# Patient Record
Sex: Male | Born: 1945 | Race: White | Hispanic: No | Marital: Married | Smoking: Former smoker
Health system: Southern US, Community
[De-identification: ages and names within clinical notes are randomized; demographics above are authoritative.]

## PROBLEM LIST (undated history)

## (undated) DIAGNOSIS — I10 Essential (primary) hypertension: Secondary | ICD-10-CM

## (undated) DIAGNOSIS — M1990 Unspecified osteoarthritis, unspecified site: Secondary | ICD-10-CM

## (undated) DIAGNOSIS — I2510 Atherosclerotic heart disease of native coronary artery without angina pectoris: Secondary | ICD-10-CM

## (undated) DIAGNOSIS — C801 Malignant (primary) neoplasm, unspecified: Secondary | ICD-10-CM

## (undated) DIAGNOSIS — N289 Disorder of kidney and ureter, unspecified: Secondary | ICD-10-CM

## (undated) HISTORY — PX: CHOLECYSTECTOMY: SHX55

## (undated) HISTORY — PX: LITHOTRIPSY: SUR834

## (undated) HISTORY — PX: KNEE SURGERY: SHX244

## (undated) HISTORY — DX: Atherosclerotic heart disease of native coronary artery without angina pectoris: I25.10

---

## 2002-04-24 ENCOUNTER — Ambulatory Visit (HOSPITAL_COMMUNITY): Admission: RE | Admit: 2002-04-24 | Discharge: 2002-04-24 | Payer: Self-pay | Admitting: Family Medicine

## 2002-04-24 ENCOUNTER — Encounter: Payer: Self-pay | Admitting: Family Medicine

## 2002-08-03 ENCOUNTER — Ambulatory Visit (HOSPITAL_COMMUNITY): Admission: RE | Admit: 2002-08-03 | Discharge: 2002-08-03 | Payer: Self-pay | Admitting: Family Medicine

## 2002-08-03 ENCOUNTER — Encounter: Payer: Self-pay | Admitting: Family Medicine

## 2005-04-23 ENCOUNTER — Ambulatory Visit (HOSPITAL_COMMUNITY): Admission: RE | Admit: 2005-04-23 | Discharge: 2005-04-23 | Payer: Self-pay | Admitting: Family Medicine

## 2005-04-24 ENCOUNTER — Ambulatory Visit (HOSPITAL_COMMUNITY): Admission: RE | Admit: 2005-04-24 | Discharge: 2005-04-24 | Payer: Self-pay | Admitting: Urology

## 2005-04-30 ENCOUNTER — Ambulatory Visit (HOSPITAL_COMMUNITY): Admission: RE | Admit: 2005-04-30 | Discharge: 2005-04-30 | Payer: Self-pay | Admitting: Urology

## 2009-08-23 ENCOUNTER — Ambulatory Visit (HOSPITAL_COMMUNITY): Admission: RE | Admit: 2009-08-23 | Discharge: 2009-08-23 | Payer: Self-pay | Admitting: Family Medicine

## 2010-08-18 NOTE — H&P (Signed)
Richard Conway, Richard Conway               ACCOUNT NO.:  [id number]   MEDICAL RECORD NO.:  [id number]          PATIENT TYPE:  AMB   LOCATION:  DAY                           FACILITY:  [facility]   PHYSICIAN:  Dennie Maizes, M.D.   DATE OF BIRTH:  [birth year]   DATE OF ADMISSION:  04/30/2005  DATE OF DISCHARGE:  LH                                HISTORY & PHYSICAL   CHIEF COMPLAINT:  Intermittently left flank pain, left ureteral calculus  with obstruction.   HISTORY OF PRESENT ILLNESS:  This 65 year old male has a past history of  recurrent urolithiasis.  He passed stones in 1985 and 1997.  He has been  having intermittent left flank pain radiating to the front for the past two  weeks.  He also has suprapubic.  He has been evaluated with a noncontrast CT  scan of abdomen and pelvis.  This revealed a 3 cm size cyst in the right  kidney with a 7 mm sized left distal ureteral calculus obstruction and mild  hydroureter.  The patient has not been able to pass the stone.  There is  urinary frequency x5-6 and nocturia x0.  There is no history of hematuria,  fever or chills.  He was brought to the [facility] today for  cystoscopy, left retrograde pyelogram and urethroscopy stone extraction.   PAST MEDICAL HISTORY:  1.  Rectal and urolithiasis in 1985 and 1997.  2.  History of hypertension.  3.  Arthritis.  4.  Status post cholecystectomy.  5.  Status post arthroscopy knee surgery.   MEDICATIONS:  1.  Benazepril 10 mg one p.o. daily.  2.  Tylenol Arthritis.  3.  Aspirin 81 mg one p.o. daily which has been discontinued for the      surgery.   ALLERGIES:  None.   FAMILY HISTORY:  Positive for diabetes mellitus, cancer and heart disease.   MEDICATIONS:  Mepergan 50/25 mg one p.o. q.4 h p.r.n. pain.   PHYSICAL EXAMINATION:  VITAL SIGNS:  Height 6 feet 1 inches, weight 241  pounds.  HEENT:  Normal.  NECK:  No masses.  LUNGS:  Clear to auscultation.  HEART:  Regular rate and rhythm, no  murmurs.  ABDOMEN:  Soft, no palpable flank mass.  Moderate left costovertebral angle  tenderness is noted.  Bladder not palpable.  Penis and testis is normal.  RECTAL:  Benign prostate.   IMPRESSION:  1.  Left distal ureteral calculus with obstruction.  2.  Left renal colic.   PLAN:  Cystoscopy, left retrograde pyelogram, ureteroscopy stone extraction,  left ureteral stent placement and anesthesia in [facility].  I have  discussed with the patient regarding the diagnosis, operative details,  alternate treatment and outcome, possible risks and complications and he has  agreed for the procedure to be done.      Dennie Maizes, M.D.  Electronically Signed     SK/MEDQ  D:  04/30/2005  T:  04/30/2005  Job:  [id number]   cc:   Angus G. Renard Matter, MD  Fax: [phone]   [facility]

## 2010-08-18 NOTE — Op Note (Signed)
Richard Conway, Richard Conway               ACCOUNT NO.:  [id number]   MEDICAL RECORD NO.:  [id number]          PATIENT TYPE:  AMB   LOCATION:  DAY                           FACILITY:  [facility]   PHYSICIAN:  Dennie Maizes, M.D.   DATE OF BIRTH:  02-11-1946   DATE OF PROCEDURE:  DATE OF DISCHARGE:                                 OPERATIVE REPORT   PREOP DIAGNOSIS:  1.  Left distal ureteral calculus with obstruction.  2.  Left renal colic.   POSTOP DIAGNOSIS:  1.  Left distal ureteral calculus with obstruction.  2.  Left renal colic.   OPERATIVE PROCEDURE:  1.  Cystoscopy.  2.  Left retrograde pyelogram.  3.  Left ureteroscopic stone extraction.  4.  Left ureteral stent placement.   ANESTHESIA:  General.   SURGEON:  Dennie Maizes, M.D.   COMPLICATIONS:  None.   ESTIMATED BLOOD LOSS:  Minimal.   DRAINS:  A 6-French, 26 cm size left ureteral stent with a string.   INDICATIONS FOR PROCEDURE:  This 65 year old male has recurrent  urolithiasis. He was evaluated for left renal colic. He had a large stone in  the left distal ureter with obstruction. The patient was unable to pass the  stone. He was taken to the OR today for cystoscopy, left retrograde  pyelogram, ureteroscopic, stone extraction, and left ureteral stent  placement.   DESCRIPTION OF PROCEDURE:  General anesthesia was induced and the patient  was placed on the OR table in the dorsolithotomy position. The lower abdomen  and genitalia were prepped and draped in a sterile fashion. Cystoscopy was  done with the 25-French scope. Urethra was normal. There was moderate  hypertrophy with partial obstruction of the bladder neck area. The bladder  was examined and found to be normal. The trigone, ureteral orifices, and  bladder mucosa were unremarkable. A 5-French, wedge catheter was then placed  in the left ureteral orifice. About 7 mL of Renografin-60 was injected into  the collecting system and a retrograde pyelogram was done  with C-arm  fluoroscopy. There was a large filling defect in the left intramural ureter.  There was proximal hydroureter and hydronephrosis.   A 5-French open-ended catheter was then placed in the left distal ureter. A  0.138-inch Benson guidewire with a flexible tip was then advanced into the  left renal pelvis without any difficulty. The open-ended catheter was then  removed. The distal ureter was then dilated using an 18-French, 4-cm in  length balloon dilating catheter. The balloon dilating catheter was then  removed leaving the guidewire in place. Ureteroscopy was done with the rigid  ureteroscope. The stone was seen in the intramural ureter. The stone was  trapped in 4-wire Nitanol wire basket and removed without any difficulty.  The stone measured about 12 mm x 6 mm in size.   Ureteroscopy was done up to the level of the pelvic brim and no other  abnormality was noted. A 6-French 26 cm size stent with a string was then  inserted into the left collecting system. The instruments were removed. The  patient remained stable throughout  the procedure. He was transferred to the  PACU in a satisfactory condition.      Dennie Maizes, M.D.  Electronically Signed     SK/MEDQ  D:  04/30/2005  T:  04/30/2005  Job:  [id number]   cc:   Angus G. Renard Matter, MD  Fax: [phone]

## 2012-11-01 ENCOUNTER — Encounter (HOSPITAL_COMMUNITY): Payer: Self-pay

## 2012-11-01 ENCOUNTER — Emergency Department (HOSPITAL_COMMUNITY)
Admission: EM | Admit: 2012-11-01 | Discharge: 2012-11-01 | Disposition: A | Discharge status: Home / Self Care | Payer: Medicare Other | Attending: Emergency Medicine | Admitting: Emergency Medicine

## 2012-11-01 DIAGNOSIS — R5383 Other fatigue: Secondary | ICD-10-CM | POA: Insufficient documentation

## 2012-11-01 DIAGNOSIS — Z87448 Personal history of other diseases of urinary system: Secondary | ICD-10-CM | POA: Insufficient documentation

## 2012-11-01 DIAGNOSIS — Z8669 Personal history of other diseases of the nervous system and sense organs: Secondary | ICD-10-CM | POA: Insufficient documentation

## 2012-11-01 DIAGNOSIS — I10 Essential (primary) hypertension: Secondary | ICD-10-CM | POA: Insufficient documentation

## 2012-11-01 DIAGNOSIS — Z8739 Personal history of other diseases of the musculoskeletal system and connective tissue: Secondary | ICD-10-CM | POA: Insufficient documentation

## 2012-11-01 DIAGNOSIS — R5381 Other malaise: Secondary | ICD-10-CM | POA: Insufficient documentation

## 2012-11-01 DIAGNOSIS — Z79899 Other long term (current) drug therapy: Secondary | ICD-10-CM | POA: Insufficient documentation

## 2012-11-01 DIAGNOSIS — R42 Dizziness and giddiness: Secondary | ICD-10-CM | POA: Insufficient documentation

## 2012-11-01 HISTORY — DX: Disorder of kidney and ureter, unspecified: N28.9

## 2012-11-01 HISTORY — DX: Unspecified osteoarthritis, unspecified site: M19.90

## 2012-11-01 HISTORY — DX: Essential (primary) hypertension: I10

## 2012-11-01 LAB — BASIC METABOLIC PANEL
CO2: 23 mEq/L (ref 19–32)
Chloride: 102 mEq/L (ref 96–112)
Glucose, Bld: 117 mg/dL — ABNORMAL HIGH (ref 70–99)
Potassium: 3.6 mEq/L (ref 3.5–5.1)
Sodium: 138 mEq/L (ref 135–145)

## 2012-11-01 LAB — CBC WITH DIFFERENTIAL/PLATELET
Basophils Absolute: 0 10*3/uL (ref 0.0–0.1)
Basophils Relative: 0 % (ref 0–1)
Eosinophils Absolute: 0.1 10*3/uL (ref 0.0–0.7)
Eosinophils Relative: 1 % (ref 0–5)
Hemoglobin: 15.1 g/dL (ref 13.0–17.0)
MCH: 31.8 pg (ref 26.0–34.0)
MCHC: 34 g/dL (ref 30.0–36.0)
MCV: 93.5 fL (ref 78.0–100.0)
Monocytes Absolute: 0.6 10*3/uL (ref 0.1–1.0)
Monocytes Relative: 6 % (ref 3–12)
RBC: 4.75 MIL/uL (ref 4.22–5.81)

## 2012-11-01 LAB — TROPONIN I: Troponin I: <0.3 ng/mL (ref <0.30)

## 2012-11-01 MED ORDER — SODIUM CHLORIDE 0.9 % IV BOLUS (SEPSIS)
1000.0000 mL | Freq: Once | INTRAVENOUS | Status: AC
  Administered 2012-11-01: 1000 mL via INTRAVENOUS

## 2012-11-01 MED ORDER — MECLIZINE HCL 25 MG PO TABS: 25.0000 mg | ORAL_TABLET | Freq: Four times a day (QID) | ORAL | Status: DC | PRN

## 2012-11-01 NOTE — ED Notes (Signed)
Pt states he almost passed out Thursday and again today. Denies other symptoms

## 2012-11-01 NOTE — ED Notes (Signed)
Pt states that he was dizzy from lying to setting but not setting to standing. Richard Conway

## 2012-11-01 NOTE — ED Provider Notes (Signed)
CSN: [id number]     Arrival date & time 11/01/12  1317 History     First MD Initiated Contact with Patient 11/01/12 1353     Chief Complaint  Patient presents with  . Near Syncope   (Consider location/radiation/quality/duration/timing/severity/associated sxs/prior Treatment) HPI This 67 year old male had a spell for several minutes today of sudden vertigo and previous similar spell 2 days ago also lasting for several minutes, each time he been walking from his kitchen towards his bedroom and he turned and had sudden room spinning sensation without headache change in hearing or change in speech vision swallowing or understanding, he had no chest pain palpitations or shortness of breath, he is no nausea or vomiting, he had no focal or lateralizing weakness or numbness or incoordination but felt generally weak, there is no trauma, he denies feeling lightheaded as if he is going to faint, he has chronic tinnitus which is unchanged recently, 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 Past Medical History  Diagnosis Date  . Arthritis   . Hypertension   . Renal disorder    Past Surgical History  Procedure Laterality Date  . Cholecystectomy     History reviewed. No pertinent family history. History  Substance Use Topics  . Smoking status: Not on file  . Smokeless tobacco: Not on file  . Alcohol Use: No    Review of Systems 10 Systems reviewed and are negative for acute change except as noted in the HPI. Allergies  Advil  Home Medications   Current Outpatient Rx  Name  Route  Sig  Dispense  Refill  . benazepril (LOTENSIN) 10 MG tablet   Oral   Take 10 mg by mouth daily.         . Glucosamine HCl (GLUCOSAMINE PO)   Oral   Take 1  tablet by mouth daily.         . meloxicam (MOBIC) 15 MG tablet   Oral   Take 7.5 mg by mouth daily.         . Omega-3 Fatty Acids (FISH OIL) 500 MG CAPS   Oral   Take 1 capsule by mouth 2 (two) times daily.         Marland Kitchen HYDROcodone-acetaminophen (NORCO) 7.5-325 MG per tablet   Oral   Take 1 tablet by mouth 2 (two) times daily as needed for pain.         . meclizine (ANTIVERT) 25 MG tablet   Oral   Take 1 tablet (25 mg total) by mouth every 6 (six) hours as needed for dizziness.   20 tablet   0   . PRESCRIPTION MEDICATION   Injection   Inject 1 each as directed every 6 (six) months. Steroid injection          BP 144/129  Pulse 129  Temp(Src) 98.5 F (36.9 C) (Oral)  Resp 20  Ht 6\' 1"  (1.854 m)  Wt 255 lb (115.667 kg)  BMI 33.65 kg/m2  SpO2 100% Physical Exam  Nursing note and vitals reviewed. Constitutional:  Awake, alert, nontoxic appearance with baseline speech for patient.  HENT:  Head: Atraumatic.  Mouth/Throat: No oropharyngeal exudate.  Eyes: EOM are normal. Pupils are equal, round, and reactive to light. Right eye exhibits no discharge. Left  eye exhibits no discharge.  No nystagmus, negative test of skew  Neck: Neck supple.  Cardiovascular: Normal rate and regular rhythm.   No murmur heard. Pulmonary/Chest: Effort normal and breath sounds normal. No stridor. No respiratory distress. He has no wheezes. He has no rales. He exhibits no tenderness.  Abdominal: Soft. Bowel sounds are normal. He exhibits no mass. There is no tenderness. There is no rebound.  Musculoskeletal: He exhibits no edema and no tenderness.  Baseline ROM, moves extremities with no obvious new focal weakness.  Lymphadenopathy:    He has no cervical adenopathy.  Neurological: He is alert.  Awake, alert, cooperative and aware of situation; motor strength bilaterally; sensation normal to light touch bilaterally; peripheral visual fields full to confrontation; no facial asymmetry; tongue  midline; major cranial nerves appear intact; no pronator drift, normal finger to nose bilaterally, baseline gait without new ataxia.  Skin: No rash noted.  Psychiatric: He has a normal mood and affect.    ED Course  ECG: Normal sinus rhythm, ventricular rate 91, normal axis, no acute ischemic changes noted, no comparison ECG available Procedures (including critical care time) Pt stable in ED with no significant deterioration in condition.Pt walking without difficulty. Patient / Family / Caregiver informed of clinical course, understand medical decision-making process, and agree with plan. Labs Reviewed  CBC WITH DIFFERENTIAL - Abnormal; Notable for the following:    Neutrophils Relative % 79 (*)    Neutro Abs 8.0 (*)    All other components within normal limits  BASIC METABOLIC PANEL - Abnormal; Notable for the following:    Glucose, Bld 117 (*)    All other components within normal limits  TROPONIN I   No results found. 1. Vertigo     MDM  I doubt any other EMC precluding discharge at this time including, but not necessarily limited to the following:SAH, CVA, Vtach.  Hurman Horn, MD 11/01/12 [phone]

## 2015-04-03 ENCOUNTER — Encounter (HOSPITAL_COMMUNITY): Payer: Self-pay | Admitting: Emergency Medicine

## 2015-04-03 ENCOUNTER — Emergency Department (HOSPITAL_COMMUNITY): Payer: Medicare Other

## 2015-04-03 ENCOUNTER — Emergency Department (HOSPITAL_COMMUNITY)
Admission: EM | Admit: 2015-04-03 | Discharge: 2015-04-03 | Disposition: A | Discharge status: Home / Self Care | Payer: Medicare Other | Attending: Emergency Medicine | Admitting: Emergency Medicine

## 2015-04-03 DIAGNOSIS — M1990 Unspecified osteoarthritis, unspecified site: Secondary | ICD-10-CM | POA: Insufficient documentation

## 2015-04-03 DIAGNOSIS — I10 Essential (primary) hypertension: Secondary | ICD-10-CM | POA: Insufficient documentation

## 2015-04-03 DIAGNOSIS — Z79899 Other long term (current) drug therapy: Secondary | ICD-10-CM | POA: Diagnosis not present

## 2015-04-03 DIAGNOSIS — Z791 Long term (current) use of non-steroidal anti-inflammatories (NSAID): Secondary | ICD-10-CM | POA: Insufficient documentation

## 2015-04-03 DIAGNOSIS — Z87442 Personal history of urinary calculi: Secondary | ICD-10-CM | POA: Diagnosis not present

## 2015-04-03 DIAGNOSIS — J019 Acute sinusitis, unspecified: Secondary | ICD-10-CM | POA: Insufficient documentation

## 2015-04-03 DIAGNOSIS — R05 Cough: Secondary | ICD-10-CM | POA: Diagnosis present

## 2015-04-03 LAB — RAPID STREP SCREEN (MED CTR MEBANE ONLY): Streptococcus, Group A Screen (Direct): NEGATIVE

## 2015-04-03 MED ORDER — AMOXICILLIN-POT CLAVULANATE ER 1000-62.5 MG PO TB12: 2.0000 | ORAL_TABLET | Freq: Two times a day (BID) | ORAL | Status: DC

## 2015-04-03 MED ORDER — AMOXICILLIN-POT CLAVULANATE 875-125 MG PO TABS
ORAL_TABLET | ORAL | Status: AC
  Filled 2015-04-03: qty 1

## 2015-04-03 NOTE — ED Provider Notes (Signed)
CSN: [id number]     Arrival date & time 04/03/15  1227 History   First MD Initiated Contact with Patient 04/03/15 1353     Chief Complaint  Patient presents with  . Cough      Patient is a 70 y.o. male presenting with cough. The history is provided by the patient.  Cough Associated symptoms: myalgias and sore throat   Associated symptoms: no chest pain, no headaches, no rash and no shortness of breath    patient has had a cough for the last 3 weeks. Seen by his primary care doctor and was given what sounds like azithromycin. States he has not gotten any better. He's felt worse last few days. He's had a nonproductive cough. He does have sinus congestion and pressure. He has had a sore throat started last few days. States he aches all over. No nausea or vomiting. No diarrhea. No fevers.  Past Medical History  Diagnosis Date  . Arthritis   . Hypertension   . Renal disorder     kidney stones   Past Surgical History  Procedure Laterality Date  . Cholecystectomy    . Knee surgery     No family history on file. Social History  Substance Use Topics  . Smoking status: Never Smoker   . Smokeless tobacco: None  . Alcohol Use: No    Review of Systems  Constitutional: Positive for fatigue. Negative for activity change and appetite change.  HENT: Positive for congestion, sinus pressure and sore throat. Negative for trouble swallowing.   Eyes: Negative for pain.  Respiratory: Positive for cough. Negative for chest tightness and shortness of breath.   Cardiovascular: Negative for chest pain and leg swelling.  Gastrointestinal: Negative for nausea, vomiting, abdominal pain and diarrhea.  Genitourinary: Negative for flank pain.  Musculoskeletal: Positive for myalgias. Negative for back pain and neck stiffness.  Skin: Negative for rash.  Neurological: Negative for weakness, numbness and headaches.  Psychiatric/Behavioral: Negative for behavioral problems.      Allergies  Advil and  Nyquil multi-symptom  Home Medications   Prior to Admission medications   Medication Sig Start Date End Date Taking? Authorizing Provider  acetaminophen (TYLENOL) 325 MG tablet Take 650 mg by mouth every 4 (four) hours as needed for mild pain.   Yes Historical Provider, MD  benazepril (LOTENSIN) 10 MG tablet Take 10 mg by mouth daily.   Yes Historical Provider, MD  Glucosamine HCl (GLUCOSAMINE PO) Take 1 tablet by mouth daily.   Yes Historical Provider, MD  meclizine (ANTIVERT) 25 MG tablet Take 1 tablet (25 mg total) by mouth every 6 (six) hours as needed for dizziness. Patient taking differently: Take 25 mg by mouth 2 (two) times daily.  11/01/12  Yes Riki Altes, MD  meloxicam (MOBIC) 15 MG tablet Take 7.5 mg by mouth daily.   Yes Historical Provider, MD  Multiple Vitamin (MULTIVITAMIN WITH MINERALS) TABS tablet Take 1 tablet by mouth daily.   Yes Historical Provider, MD  Omega-3 Fatty Acids (FISH OIL) 500 MG CAPS Take 1 capsule by mouth 2 (two) times daily.   Yes Historical Provider, MD  amoxicillin-clavulanate (AUGMENTIN XR) 1000-62.5 MG 12 hr tablet Take 2 tablets by mouth 2 (two) times daily. 04/03/15   Davonna Belling, MD  PRESCRIPTION MEDICATION Inject 1 each as directed every 6 (six) months. Steroid injection    Historical Provider, MD   BP 190/102 mmHg  Pulse 75  Temp(Src) 98 F (36.7 C) (Oral)  Resp 18  Ht 6'  1" (1.854 m)  Wt 250 lb (113.399 kg)  BMI 32.99 kg/m2  SpO2 97% Physical Exam  Constitutional: He appears well-developed.  HENT:  Mouth/Throat: No oropharyngeal exudate.  Posterior pharyngeal erythema without exudate.  Neck: Neck supple.  Cardiovascular: Normal rate.   Pulmonary/Chest: Effort normal. No respiratory distress. He has no wheezes.  Abdominal: Soft. There is no tenderness.  Musculoskeletal: Normal range of motion.  Neurological: He is alert.  Skin: Skin is warm.    ED Course  Procedures (including critical care time) Labs Review Labs Reviewed   RAPID STREP SCREEN (NOT AT [facility])  CULTURE, GROUP A STREP    Imaging Review Dg Chest 2 View  04/03/2015  CLINICAL DATA:  Initial encounter for three-week history of cough with fever and nausea worsening last night. EXAM: CHEST  2 VIEW COMPARISON:  None. FINDINGS: Asymmetric elevation right hemidiaphragm noted. The lungs are clear wiithout focal pneumonia, edema, pneumothorax or pleural effusion. The cardiopericardial silhouette is within normal limits for size. The visualized bony structures of the thorax are intact. IMPRESSION: No active cardiopulmonary disease. Electronically Signed   By: Misty Stanley M.D.   On: 04/03/2015 13:33   I have personally reviewed and evaluated these images and lab results as part of my medical decision-making.   EKG Interpretation None      MDM   Final diagnoses:  Acute sinusitis, recurrence not specified, unspecified location    Patient with cough. Also sinus pressure and sore throat. Had been treated for bronchitis but with the new sinus pressure and possible chills now we'll treat as sinusitis. Will discharge home.    Davonna Belling, MD 04/03/15 2113

## 2015-04-03 NOTE — Discharge Instructions (Signed)

## 2015-04-03 NOTE — ED Notes (Signed)
MD at bedside. 

## 2015-04-03 NOTE — ED Notes (Signed)
Pt reports fever, cough, nasal/chest congestion, and nausea x 3 weeks. Pt reports thick, yellow sputum,. Pt states he was seen by PCP 3 weeks ago, but symptoms have not improved.

## 2015-04-06 LAB — CULTURE, GROUP A STREP: STREP A CULTURE: NEGATIVE

## 2016-01-11 DIAGNOSIS — M25569 Pain in unspecified knee: Secondary | ICD-10-CM | POA: Diagnosis not present

## 2016-01-11 DIAGNOSIS — Z23 Encounter for immunization: Secondary | ICD-10-CM | POA: Diagnosis not present

## 2016-01-11 DIAGNOSIS — I10 Essential (primary) hypertension: Secondary | ICD-10-CM | POA: Diagnosis not present

## 2016-01-11 DIAGNOSIS — M1990 Unspecified osteoarthritis, unspecified site: Secondary | ICD-10-CM | POA: Diagnosis not present

## 2016-02-20 DIAGNOSIS — I10 Essential (primary) hypertension: Secondary | ICD-10-CM | POA: Diagnosis not present

## 2016-02-28 DIAGNOSIS — M159 Polyosteoarthritis, unspecified: Secondary | ICD-10-CM | POA: Diagnosis not present

## 2016-02-28 DIAGNOSIS — I10 Essential (primary) hypertension: Secondary | ICD-10-CM | POA: Diagnosis not present

## 2016-02-28 DIAGNOSIS — E782 Mixed hyperlipidemia: Secondary | ICD-10-CM | POA: Diagnosis not present

## 2016-03-22 DIAGNOSIS — H6502 Acute serous otitis media, left ear: Secondary | ICD-10-CM | POA: Diagnosis not present

## 2016-04-04 DIAGNOSIS — H6503 Acute serous otitis media, bilateral: Secondary | ICD-10-CM | POA: Diagnosis not present

## 2016-08-29 DIAGNOSIS — I10 Essential (primary) hypertension: Secondary | ICD-10-CM | POA: Diagnosis not present

## 2016-08-31 DIAGNOSIS — I10 Essential (primary) hypertension: Secondary | ICD-10-CM | POA: Diagnosis not present

## 2016-08-31 DIAGNOSIS — M1990 Unspecified osteoarthritis, unspecified site: Secondary | ICD-10-CM | POA: Diagnosis not present

## 2016-08-31 DIAGNOSIS — E782 Mixed hyperlipidemia: Secondary | ICD-10-CM | POA: Diagnosis not present

## 2017-02-27 DIAGNOSIS — E782 Mixed hyperlipidemia: Secondary | ICD-10-CM | POA: Diagnosis not present

## 2017-02-27 DIAGNOSIS — I10 Essential (primary) hypertension: Secondary | ICD-10-CM | POA: Diagnosis not present

## 2017-02-27 DIAGNOSIS — Z125 Encounter for screening for malignant neoplasm of prostate: Secondary | ICD-10-CM | POA: Diagnosis not present

## 2017-03-04 DIAGNOSIS — I10 Essential (primary) hypertension: Secondary | ICD-10-CM | POA: Diagnosis not present

## 2017-06-05 DIAGNOSIS — R7301 Impaired fasting glucose: Secondary | ICD-10-CM | POA: Diagnosis not present

## 2017-06-05 DIAGNOSIS — M1990 Unspecified osteoarthritis, unspecified site: Secondary | ICD-10-CM | POA: Diagnosis not present

## 2017-09-02 DIAGNOSIS — I10 Essential (primary) hypertension: Secondary | ICD-10-CM | POA: Diagnosis not present

## 2017-09-02 DIAGNOSIS — R7301 Impaired fasting glucose: Secondary | ICD-10-CM | POA: Diagnosis not present

## 2017-09-02 DIAGNOSIS — E782 Mixed hyperlipidemia: Secondary | ICD-10-CM | POA: Diagnosis not present

## 2017-09-04 DIAGNOSIS — M1990 Unspecified osteoarthritis, unspecified site: Secondary | ICD-10-CM | POA: Diagnosis not present

## 2017-09-04 DIAGNOSIS — E782 Mixed hyperlipidemia: Secondary | ICD-10-CM | POA: Diagnosis not present

## 2017-09-04 DIAGNOSIS — I10 Essential (primary) hypertension: Secondary | ICD-10-CM | POA: Diagnosis not present

## 2017-09-04 DIAGNOSIS — R7301 Impaired fasting glucose: Secondary | ICD-10-CM | POA: Diagnosis not present

## 2018-02-17 DIAGNOSIS — Z23 Encounter for immunization: Secondary | ICD-10-CM | POA: Diagnosis not present

## 2018-03-03 DIAGNOSIS — R7301 Impaired fasting glucose: Secondary | ICD-10-CM | POA: Diagnosis not present

## 2018-03-03 DIAGNOSIS — I10 Essential (primary) hypertension: Secondary | ICD-10-CM | POA: Diagnosis not present

## 2018-03-03 DIAGNOSIS — E782 Mixed hyperlipidemia: Secondary | ICD-10-CM | POA: Diagnosis not present

## 2018-03-17 DIAGNOSIS — I10 Essential (primary) hypertension: Secondary | ICD-10-CM | POA: Diagnosis not present

## 2018-03-17 DIAGNOSIS — Z0000 Encounter for general adult medical examination without abnormal findings: Secondary | ICD-10-CM | POA: Diagnosis not present

## 2018-03-17 DIAGNOSIS — R7303 Prediabetes: Secondary | ICD-10-CM | POA: Diagnosis not present

## 2018-03-17 DIAGNOSIS — E782 Mixed hyperlipidemia: Secondary | ICD-10-CM | POA: Diagnosis not present

## 2018-03-17 DIAGNOSIS — M1990 Unspecified osteoarthritis, unspecified site: Secondary | ICD-10-CM | POA: Diagnosis not present

## 2018-04-14 DIAGNOSIS — Z0000 Encounter for general adult medical examination without abnormal findings: Secondary | ICD-10-CM | POA: Diagnosis not present

## 2018-04-14 DIAGNOSIS — H9190 Unspecified hearing loss, unspecified ear: Secondary | ICD-10-CM | POA: Diagnosis not present

## 2018-04-14 DIAGNOSIS — I10 Essential (primary) hypertension: Secondary | ICD-10-CM | POA: Diagnosis not present

## 2018-04-29 DIAGNOSIS — I10 Essential (primary) hypertension: Secondary | ICD-10-CM | POA: Diagnosis not present

## 2018-04-29 DIAGNOSIS — H9190 Unspecified hearing loss, unspecified ear: Secondary | ICD-10-CM | POA: Diagnosis not present

## 2018-05-15 ENCOUNTER — Ambulatory Visit (INDEPENDENT_AMBULATORY_CARE_PROVIDER_SITE_OTHER): Payer: Medicare Other | Admitting: Otolaryngology

## 2018-05-15 DIAGNOSIS — H903 Sensorineural hearing loss, bilateral: Secondary | ICD-10-CM | POA: Diagnosis not present

## 2018-07-21 DIAGNOSIS — M25562 Pain in left knee: Secondary | ICD-10-CM | POA: Diagnosis not present

## 2018-08-18 DIAGNOSIS — R7301 Impaired fasting glucose: Secondary | ICD-10-CM | POA: Diagnosis not present

## 2018-08-18 DIAGNOSIS — R7303 Prediabetes: Secondary | ICD-10-CM | POA: Diagnosis not present

## 2018-08-21 DIAGNOSIS — H9190 Unspecified hearing loss, unspecified ear: Secondary | ICD-10-CM | POA: Diagnosis not present

## 2018-08-21 DIAGNOSIS — R7303 Prediabetes: Secondary | ICD-10-CM | POA: Diagnosis not present

## 2018-08-21 DIAGNOSIS — R7301 Impaired fasting glucose: Secondary | ICD-10-CM | POA: Diagnosis not present

## 2018-08-21 DIAGNOSIS — E782 Mixed hyperlipidemia: Secondary | ICD-10-CM | POA: Diagnosis not present

## 2018-08-21 DIAGNOSIS — I10 Essential (primary) hypertension: Secondary | ICD-10-CM | POA: Diagnosis not present

## 2018-09-09 DIAGNOSIS — I10 Essential (primary) hypertension: Secondary | ICD-10-CM | POA: Diagnosis not present

## 2018-09-09 DIAGNOSIS — R7303 Prediabetes: Secondary | ICD-10-CM | POA: Diagnosis not present

## 2018-09-09 DIAGNOSIS — H9190 Unspecified hearing loss, unspecified ear: Secondary | ICD-10-CM | POA: Diagnosis not present

## 2018-09-09 DIAGNOSIS — E782 Mixed hyperlipidemia: Secondary | ICD-10-CM | POA: Diagnosis not present

## 2018-09-09 DIAGNOSIS — R7301 Impaired fasting glucose: Secondary | ICD-10-CM | POA: Diagnosis not present

## 2018-11-13 DIAGNOSIS — I10 Essential (primary) hypertension: Secondary | ICD-10-CM | POA: Diagnosis not present

## 2018-11-13 DIAGNOSIS — M1990 Unspecified osteoarthritis, unspecified site: Secondary | ICD-10-CM | POA: Diagnosis not present

## 2018-11-13 DIAGNOSIS — M545 Low back pain: Secondary | ICD-10-CM | POA: Diagnosis not present

## 2018-11-13 DIAGNOSIS — H9190 Unspecified hearing loss, unspecified ear: Secondary | ICD-10-CM | POA: Diagnosis not present

## 2019-02-16 DIAGNOSIS — R7301 Impaired fasting glucose: Secondary | ICD-10-CM | POA: Diagnosis not present

## 2019-02-16 DIAGNOSIS — R7303 Prediabetes: Secondary | ICD-10-CM | POA: Diagnosis not present

## 2019-02-18 DIAGNOSIS — H9190 Unspecified hearing loss, unspecified ear: Secondary | ICD-10-CM | POA: Diagnosis not present

## 2019-02-18 DIAGNOSIS — R7301 Impaired fasting glucose: Secondary | ICD-10-CM | POA: Diagnosis not present

## 2019-02-18 DIAGNOSIS — E782 Mixed hyperlipidemia: Secondary | ICD-10-CM | POA: Diagnosis not present

## 2019-02-18 DIAGNOSIS — I10 Essential (primary) hypertension: Secondary | ICD-10-CM | POA: Diagnosis not present

## 2019-02-18 DIAGNOSIS — R7303 Prediabetes: Secondary | ICD-10-CM | POA: Diagnosis not present

## 2019-04-27 DIAGNOSIS — M1990 Unspecified osteoarthritis, unspecified site: Secondary | ICD-10-CM | POA: Diagnosis not present

## 2019-04-27 DIAGNOSIS — M545 Low back pain: Secondary | ICD-10-CM | POA: Diagnosis not present

## 2019-04-27 DIAGNOSIS — H9190 Unspecified hearing loss, unspecified ear: Secondary | ICD-10-CM | POA: Diagnosis not present

## 2019-05-11 DIAGNOSIS — R0602 Shortness of breath: Secondary | ICD-10-CM | POA: Diagnosis not present

## 2019-05-11 DIAGNOSIS — Z136 Encounter for screening for cardiovascular disorders: Secondary | ICD-10-CM | POA: Diagnosis not present

## 2019-05-11 DIAGNOSIS — R079 Chest pain, unspecified: Secondary | ICD-10-CM | POA: Diagnosis not present

## 2019-05-11 DIAGNOSIS — R42 Dizziness and giddiness: Secondary | ICD-10-CM | POA: Diagnosis not present

## 2019-05-20 DIAGNOSIS — R079 Chest pain, unspecified: Secondary | ICD-10-CM | POA: Diagnosis not present

## 2019-05-20 DIAGNOSIS — M545 Low back pain: Secondary | ICD-10-CM | POA: Diagnosis not present

## 2019-05-20 DIAGNOSIS — R0602 Shortness of breath: Secondary | ICD-10-CM | POA: Diagnosis not present

## 2019-05-20 DIAGNOSIS — R42 Dizziness and giddiness: Secondary | ICD-10-CM | POA: Diagnosis not present

## 2019-06-08 DIAGNOSIS — M79604 Pain in right leg: Secondary | ICD-10-CM | POA: Diagnosis not present

## 2019-06-08 DIAGNOSIS — R0602 Shortness of breath: Secondary | ICD-10-CM | POA: Diagnosis not present

## 2019-06-08 DIAGNOSIS — M545 Low back pain: Secondary | ICD-10-CM | POA: Diagnosis not present

## 2019-06-08 DIAGNOSIS — K219 Gastro-esophageal reflux disease without esophagitis: Secondary | ICD-10-CM | POA: Diagnosis not present

## 2019-06-08 DIAGNOSIS — R002 Palpitations: Secondary | ICD-10-CM | POA: Diagnosis not present

## 2019-06-15 ENCOUNTER — Encounter: Payer: Self-pay | Admitting: Emergency Medicine

## 2019-06-15 ENCOUNTER — Encounter (HOSPITAL_COMMUNITY): Payer: Self-pay

## 2019-06-15 ENCOUNTER — Ambulatory Visit
Admission: EM | Admit: 2019-06-15 | Discharge: 2019-06-15 | Disposition: A | Discharge status: Home / Self Care | Payer: Medicare Other | Source: Home / Self Care | Attending: Family Medicine | Admitting: Family Medicine

## 2019-06-15 ENCOUNTER — Emergency Department (HOSPITAL_COMMUNITY): Payer: Medicare Other

## 2019-06-15 ENCOUNTER — Emergency Department (HOSPITAL_COMMUNITY)
Admission: EM | Admit: 2019-06-15 | Discharge: 2019-06-15 | Disposition: A | Discharge status: Home / Self Care | Payer: Medicare Other | Attending: Emergency Medicine | Admitting: Emergency Medicine

## 2019-06-15 ENCOUNTER — Other Ambulatory Visit: Payer: Self-pay

## 2019-06-15 DIAGNOSIS — R0789 Other chest pain: Secondary | ICD-10-CM | POA: Insufficient documentation

## 2019-06-15 DIAGNOSIS — I10 Essential (primary) hypertension: Secondary | ICD-10-CM | POA: Diagnosis not present

## 2019-06-15 DIAGNOSIS — R002 Palpitations: Secondary | ICD-10-CM | POA: Diagnosis not present

## 2019-06-15 DIAGNOSIS — Z79899 Other long term (current) drug therapy: Secondary | ICD-10-CM | POA: Insufficient documentation

## 2019-06-15 DIAGNOSIS — R079 Chest pain, unspecified: Secondary | ICD-10-CM

## 2019-06-15 DIAGNOSIS — R0602 Shortness of breath: Secondary | ICD-10-CM

## 2019-06-15 LAB — HEPATIC FUNCTION PANEL
ALT: 20 U/L (ref 0–44)
AST: 25 U/L (ref 15–41)
Albumin: 3.6 g/dL (ref 3.5–5.0)
Alkaline Phosphatase: 75 U/L (ref 38–126)
Bilirubin, Direct: 0.1 mg/dL (ref 0.0–0.2)
Indirect Bilirubin: 0.3 mg/dL (ref 0.3–0.9)
Total Bilirubin: 0.4 mg/dL (ref 0.3–1.2)
Total Protein: 7.4 g/dL (ref 6.5–8.1)

## 2019-06-15 LAB — BASIC METABOLIC PANEL
Anion gap: 11 (ref 5–15)
BUN: 17 mg/dL (ref 8–23)
CO2: 22 mmol/L (ref 22–32)
Calcium: 8.6 mg/dL — ABNORMAL LOW (ref 8.9–10.3)
Chloride: 101 mmol/L (ref 98–111)
Creatinine, Ser: 1.02 mg/dL (ref 0.61–1.24)
GFR calc Af Amer: >60 mL/min (ref >60)
GFR calc non Af Amer: >60 mL/min (ref >60)
Glucose, Bld: 140 mg/dL — ABNORMAL HIGH (ref 70–99)
Potassium: 3.8 mmol/L (ref 3.5–5.1)
Sodium: 134 mmol/L — ABNORMAL LOW (ref 135–145)

## 2019-06-15 LAB — CBC
HCT: 41 % (ref 39.0–52.0)
Hemoglobin: 12.4 g/dL — ABNORMAL LOW (ref 13.0–17.0)
MCH: 29.1 pg (ref 26.0–34.0)
MCHC: 30.2 g/dL (ref 30.0–36.0)
MCV: 96.2 fL (ref 80.0–100.0)
Platelets: 345 10*3/uL (ref 150–400)
RBC: 4.26 MIL/uL (ref 4.22–5.81)
RDW: 14.5 % (ref 11.5–15.5)
WBC: 9.1 10*3/uL (ref 4.0–10.5)
nRBC: 0 % (ref 0.0–0.2)

## 2019-06-15 LAB — TROPONIN I (HIGH SENSITIVITY)
Troponin I (High Sensitivity): <2 ng/L (ref <18)
Troponin I (High Sensitivity): <2 ng/L (ref <18)

## 2019-06-15 LAB — D-DIMER, QUANTITATIVE: D-Dimer, Quant: 0.89 ug/mL-FEU — ABNORMAL HIGH (ref 0.00–0.50)

## 2019-06-15 MED ORDER — HYDROCODONE-ACETAMINOPHEN 5-325 MG PO TABS: 1.0000 | ORAL_TABLET | Freq: Four times a day (QID) | ORAL | 0 refills | Status: DC | PRN

## 2019-06-15 MED ORDER — IOHEXOL 350 MG/ML SOLN
100.0000 mL | Freq: Once | INTRAVENOUS | Status: AC | PRN
  Administered 2019-06-15: 100 mL via INTRAVENOUS

## 2019-06-15 NOTE — ED Triage Notes (Signed)
Pt here with palpitations worse with exertion x 1 week and increased SOB with exertion; pt appears SOB at rest; pt sts some pain in right calf and right hip area; pt sts some chest tightness as well

## 2019-06-15 NOTE — ED Triage Notes (Signed)
Pt presents to ED with palpitations with exertion x 1 week, sob. Pt states at times hurts in his chest.

## 2019-06-15 NOTE — ED Provider Notes (Signed)
[facility]    CSN: [id number] Arrival date & time: 06/15/19  1523      History   Chief Complaint Chief Complaint  Patient presents with  . Palpitations  . Shortness of Breath    HPI GURVIS SASO is a 74 y.o. male history of hypertension, arthritis, presenting today for evaluation of shortness of breath.  Patient notes that over the past week he has had increasing Lee frequent episodes of shortness of breath with associated chest tightness.  He also has associated nausea.  Had an episode earlier this morning, symptoms are mainly with exertion.  Has history of hypertension, denies hyperlipidemia, denies diabetes, prior tobacco use, quit smoking in 1986.  Has plans to follow-up with cardiology on 3/24, but he was concerned about waiting this long.  He is also had some calf pain in his right calf.  Denies associated swelling or redness.  Denies prior DVT/PE.  Denies recent surgeries.  HPI  Past Medical History:  Diagnosis Date  . Arthritis   . Hypertension   . Renal disorder    kidney stones    There are no problems to display for this patient.   Past Surgical History:  Procedure Laterality Date  . CHOLECYSTECTOMY    . KNEE SURGERY         Home Medications    Prior to Admission medications   Medication Sig Start Date End Date Taking? Authorizing Provider  acetaminophen (TYLENOL) 325 MG tablet Take 650 mg by mouth every 4 (four) hours as needed for mild pain.    [provider]  amoxicillin-clavulanate (AUGMENTIN XR) 1000-62.5 MG 12 hr tablet Take 2 tablets by mouth 2 (two) times daily. Patient not taking: Reported on 06/15/2019 04/03/15   Davonna Belling, MD  benazepril (LOTENSIN) 10 MG tablet Take 10 mg by mouth daily.    [provider]  Glucosamine HCl (GLUCOSAMINE PO) Take 1 tablet by mouth daily.    [provider]  meclizine (ANTIVERT) 25 MG tablet Take 1 tablet (25 mg total) by mouth every 6 (six) hours as needed for  dizziness. Patient taking differently: Take 25 mg by mouth 2 (two) times daily.  11/01/12   Riki Altes, MD  meloxicam (MOBIC) 15 MG tablet Take 7.5 mg by mouth daily.    [provider]  Multiple Vitamin (MULTIVITAMIN WITH MINERALS) TABS tablet Take 1 tablet by mouth daily.    [provider]  Omega-3 Fatty Acids (FISH OIL) 500 MG CAPS Take 1 capsule by mouth 2 (two) times daily.    [provider]  PRESCRIPTION MEDICATION Inject 1 each as directed every 6 (six) months. Steroid injection    [provider]    Family History History reviewed. No pertinent family history.  Social History Social History   Tobacco Use  . Smoking status: Never Smoker  Substance Use Topics  . Alcohol use: No  . Drug use: No     Allergies   Advil [ibuprofen] and Nyquil multi-symptom [pseudoeph-doxylamine-dm-apap]   Review of Systems Review of Systems  Constitutional: Negative for fatigue and fever.  HENT: Negative for congestion, sinus pressure and sore throat.   Eyes: Negative for photophobia, pain and visual disturbance.  Respiratory: Positive for shortness of breath. Negative for cough.   Cardiovascular: Positive for chest pain.  Gastrointestinal: Positive for nausea. Negative for abdominal pain and vomiting.  Genitourinary: Negative for decreased urine volume and hematuria.  Musculoskeletal: Negative for myalgias, neck pain and neck stiffness.  Neurological: Negative  for dizziness, syncope, facial asymmetry, speech difficulty, weakness, light-headedness, numbness and headaches.     Physical Exam Triage Vital Signs ED Triage Vitals [06/15/19 1540]  Enc Vitals Group     BP (!) 148/89     Pulse Rate 96     Resp 18     Temp 97.9 F (36.6 C)     Temp Source Oral     SpO2 96 %     Weight      Height      Head Circumference      Peak Flow      Pain Score 0     Pain Loc      Pain Edu?      Excl. in [place]?    No data found.  Updated Vital Signs BP  (!) 148/89 (BP Location: Right Arm)   Pulse 96   Temp 97.9 F (36.6 C) (Oral)   Resp 18   SpO2 96%   Visual Acuity Right Eye Distance:   Left Eye Distance:   Bilateral Distance:    Right Eye Near:   Left Eye Near:    Bilateral Near:     Physical Exam Vitals and nursing note reviewed.  Constitutional:      Appearance: He is well-developed.     Comments: No acute distress  HENT:     Head: Normocephalic and atraumatic.     Nose: Nose normal.  Eyes:     Extraocular Movements: Extraocular movements intact.     Conjunctiva/sclera: Conjunctivae normal.     Pupils: Pupils are equal, round, and reactive to light.  Cardiovascular:     Rate and Rhythm: Normal rate.  Pulmonary:     Effort: Pulmonary effort is normal. No respiratory distress.     Comments: Breathing comfortably at rest, CTABL, no wheezing, rales or other adventitious sounds auscultated  Abdominal:     General: There is no distension.  Musculoskeletal:        General: Normal range of motion.     Cervical back: Neck supple.     Comments: fasiculations noted in right calf, no calf swelling or erythema, no calf tenderness to palpation  Skin:    General: Skin is warm and dry.  Neurological:     Mental Status: He is alert and oriented to person, place, and time.      UC Treatments / Results  Labs (all labs ordered are listed, but only abnormal results are displayed) Labs Reviewed - No data to display  EKG   Radiology No results found.  Procedures Procedures (including critical care time)  Medications Ordered in UC Medications - No data to display  Initial Impression / Assessment and Plan / UC Course  I have reviewed the triage vital signs and the nursing notes.  Pertinent labs & imaging results that were available during my care of the patient were reviewed by me and considered in my medical decision making (see chart for details).     EKG normal sinus rhythm, occasional supraventricular  complexes.  No acute signs of ischemia or infarction.  Does have variable P wave morphology.  No symptoms at rest.  Given increased exertional symptoms, suggestive of unstable angina.  Recommending further evaluation and work-up in emergency room.  Patient currently asymptomatic at time of visit discharge independently via private vehicle.  Discussed strict return precautions. Patient verbalized understanding and is agreeable with plan.  Final Clinical Impressions(s) / UC Diagnoses   Final diagnoses:  None  Discharge Instructions     Please go to ED Workup for unstable angina, possible PE   ED Prescriptions    None     PDMP not reviewed this encounter.   Joneen Caraway [place] C, PA-C 06/15/19 1622

## 2019-06-15 NOTE — ED Provider Notes (Signed)
[facility] Provider Note   CSN: [id number] Arrival date & time: 06/15/19  1613     History Chief Complaint  Patient presents with  . Shortness of Breath    Richard Conway is a 74 y.o. male with a past medical history significant for hypertension and arthritis who presents to the ED from Urgent Care due to gradual onset of worsening shortness of breath and central, nonradiating chest pressure x1 week. Patient states chest pain and shortness of breath only occurs with exertion and has worsened over the past few days. He notes he becomes short of breath with chest pain even with just standing up or walking short distances. Currently chest pain free. Associated with nausea, but no emesis. Admits to an episode earlier this morning with exertion. Chest pain and shortness of breath are relieved with rest. He also admits to right calf pain that started with the chest pain roughly 1 week ago.  Denies history of blood clots, recent surgeries, recent long immobilizations, and hormonal treatments.  Denies associative swelling or erythema. No prior episodes of chest pain before 1 week ago. Denies recent illness.   History obtained from patient and past medical records. No interpreter used during encounter.       Past Medical History:  Diagnosis Date  . Arthritis   . Hypertension   . Renal disorder    kidney stones    There are no problems to display for this patient.   Past Surgical History:  Procedure Laterality Date  . CHOLECYSTECTOMY    . KNEE SURGERY         No family history on file.  Social History   Tobacco Use  . Smoking status: Never Smoker  . Smokeless tobacco: Never Used  Substance Use Topics  . Alcohol use: No  . Drug use: No    Home Medications Prior to Admission medications   Medication Sig Start Date End Date Taking? Authorizing Provider  acetaminophen (TYLENOL) 325 MG tablet Take 650 mg by mouth every 4 (four) hours as needed for mild  pain.    [provider]  amoxicillin-clavulanate (AUGMENTIN XR) 1000-62.5 MG 12 hr tablet Take 2 tablets by mouth 2 (two) times daily. Patient not taking: Reported on 06/15/2019 04/03/15   Davonna Belling, MD  benazepril (LOTENSIN) 10 MG tablet Take 10 mg by mouth daily.    [provider]  gabapentin (NEURONTIN) 300 MG capsule Take 300 mg by mouth 3 (three) times daily. 04/27/19   [provider]  Glucosamine HCl (GLUCOSAMINE PO) Take 1 tablet by mouth daily.    [provider]  HYDROcodone-acetaminophen (NORCO/VICODIN) 5-325 MG tablet Take 1 tablet by mouth 2 (two) times daily as needed. 05/21/19   [provider]  HYDROcodone-acetaminophen (NORCO/VICODIN) 5-325 MG tablet Take 1 tablet by mouth every 6 (six) hours as needed. 06/15/19   Suzy Bouchard, PA-C  lisinopril-hydrochlorothiazide (ZESTORETIC) 10-12.5 MG tablet Take 1 tablet by mouth daily. 05/04/19   [provider]  meclizine (ANTIVERT) 25 MG tablet Take 1 tablet (25 mg total) by mouth every 6 (six) hours as needed for dizziness. Patient taking differently: Take 25 mg by mouth 2 (two) times daily.  11/01/12   Riki Altes, MD  meloxicam (MOBIC) 15 MG tablet Take 7.5 mg by mouth daily.    [provider]  Multiple Vitamin (MULTIVITAMIN WITH MINERALS) TABS tablet Take 1 tablet by mouth daily.    [provider]  Omega-3 Fatty Acids (FISH OIL) 500  MG CAPS Take 1 capsule by mouth 2 (two) times daily.    [provider]  omeprazole (PRILOSEC) 40 MG capsule Take 40 mg by mouth daily. 05/11/19   [provider]  pantoprazole (PROTONIX) 40 MG tablet Take 40 mg by mouth daily. 06/08/19   [provider]  PRESCRIPTION MEDICATION Inject 1 each as directed every 6 (six) months. Steroid injection    [provider]  traMADol (ULTRAM) 50 MG tablet Take 50 mg by mouth every 6 (six) hours as needed. 06/08/19   [provider]    Allergies      Advil [ibuprofen] and Nyquil multi-symptom [pseudoeph-doxylamine-dm-apap]  Review of Systems   Review of Systems  Constitutional: Negative for chills and fever.  Respiratory: Positive for shortness of breath. Negative for cough.   Cardiovascular: Positive for chest pain. Negative for leg swelling.  Gastrointestinal: Positive for nausea. Negative for abdominal pain, diarrhea and vomiting.  Musculoskeletal: Positive for back pain (chronic).  Skin: Negative for color change.  Neurological: Negative for numbness.  All other systems reviewed and are negative.   Physical Exam Updated Vital Signs BP (!) 141/93 (BP Location: Right Arm)   Pulse 97   Temp 97.8 F (36.6 C) (Oral)   Resp 18   Ht 6\' 1"  (1.854 m)   Wt 104.3 kg   SpO2 100%   BMI 30.34 kg/m   Physical Exam Vitals and nursing note reviewed.  Constitutional:      General: He is not in acute distress.    Appearance: He is not toxic-appearing.     Comments: Resting comfortably in bed.   HENT:     Head: Normocephalic.  Eyes:     Pupils: Pupils are equal, round, and reactive to light.  Cardiovascular:     Rate and Rhythm: Normal rate and regular rhythm.     Pulses: Normal pulses.     Heart sounds: Normal heart sounds. No murmur. No friction rub. No gallop.   Pulmonary:     Effort: Pulmonary effort is normal.     Breath sounds: Normal breath sounds.  Chest:     Comments: No anterior chest wall tenderness.  Abdominal:     General: Abdomen is flat. There is no distension.     Palpations: Abdomen is soft.     Tenderness: There is no abdominal tenderness. There is no guarding or rebound.  Musculoskeletal:     Cervical back: Neck supple.     Comments: No tenderness to palpation on bilateral calves. No lower extremity edema. Negative homans sign bilaterally. No thoracic or lumbar midline tenderness.  Skin:    General: Skin is warm and dry.  Neurological:     General: No focal deficit present.     Mental Status: He is  alert.  Psychiatric:        Mood and Affect: Mood normal.        Behavior: Behavior normal.     ED Results / Procedures / Treatments   Labs (all labs ordered are listed, but only abnormal results are displayed) Labs Reviewed  BASIC METABOLIC PANEL - Abnormal; Notable for the following components:      Result Value   Sodium 134 (*)    Glucose, Bld 140 (*)    Calcium 8.6 (*)    All other components within normal limits  CBC - Abnormal; Notable for the following components:   Hemoglobin 12.4 (*)    All other components within normal limits  D-DIMER, QUANTITATIVE (NOT AT  [facility]) - Abnormal; Notable for the following components:   D-Dimer, Quant 0.89 (*)    All other components within normal limits  HEPATIC FUNCTION PANEL  TROPONIN I (HIGH SENSITIVITY)  TROPONIN I (HIGH SENSITIVITY)    EKG None  Radiology DG Chest 2 View  Result Date: 06/15/2019 CLINICAL DATA:  Chest pain. Palpitations. Shortness of breath. EXAM: CHEST - 2 VIEW COMPARISON:  04/03/2015 FINDINGS: The cardiomediastinal contours are normal. Unchanged elevation of right hemidiaphragm. Pulmonary vasculature is normal. No consolidation, pleural effusion, or pneumothorax. No acute osseous abnormalities are seen. IMPRESSION: No acute chest findings. Electronically Signed   By: Keith Rake M.D.   On: 06/15/2019 16:53   CT Angio Chest PE W/Cm &/Or Wo Cm  Result Date: 06/15/2019 CLINICAL DATA:  74 year old male with chest pain. EXAM: CT ANGIOGRAPHY CHEST WITH CONTRAST TECHNIQUE: Multidetector CT imaging of the chest was performed using the standard protocol during bolus administration of intravenous contrast. Multiplanar CT image reconstructions and MIPs were obtained to evaluate the vascular anatomy. CONTRAST:  183mL OMNIPAQUE IOHEXOL 350 MG/ML SOLN COMPARISON:  Chest radiograph dated 06/15/2019. FINDINGS: Cardiovascular: There is no cardiomegaly or pericardial effusion. Coronary vascular calcification primarily involving  the LAD. There is mild atherosclerotic calcification of the thoracic aorta. The ascending aorta measures approximately 4 cm on the sagittal and coronal images. On the axial images the aorta measures approximately 4.4 cm in diameter which is likely over measurement and related to oblique orientation of the axial plane to the axis of the aorta. No dissection. The origins of the great vessels of the aortic arch appear patent. No pulmonary artery embolus identified. Mediastinum/Nodes: There is mediastinal adenopathy with the largest lymph node measures approximately 3.2 cm to the right of the trachea. Enlarged lymph node anterior to the ascending aorta measures 13 mm in short axis. Left supraclavicular adenopathy measures 12 mm in short axis. A borderline enlarged lymph node in the central neck posterior to the left thyroid gland measures approximately 10 mm short axis. The esophagus and the thyroid gland are grossly unremarkable. No mediastinal fluid collection. Lungs/Pleura: There are bibasilar linear atelectasis/scarring. There is a 13 mm nodule in the left upper lobe (series 8, image 51). There is a cluster of nodules in the right upper lobe (series 8 images 48-51). There is an 8 mm nodule in the right middle lobe anteriorly (series 8 image 74). No lobar consolidation, pleural effusion pneumothorax. The central airways are patent. Upper Abdomen: Indeterminate 17 mm right adrenal nodule. Mild nodularity of the left adrenal gland. There is mild haziness of the peripancreatic mesentery. Several mildly rounded peripancreatic and portacaval lymph nodes measure up to 12 mm. Cholecystectomy. An ill-defined 15 mm hypodense lesion in the dome of the liver (series 6, image 69) is not characterized. Musculoskeletal: Degenerative changes of the spine. No acute osseous pathology. Review of the MIP images confirms the above findings. IMPRESSION: 1. No CT evidence of pulmonary embolism. 2. Mediastinal, left supraclavicular, and  upper abdominal adenopathy of indeterminate etiology. Clinical correlation and further evaluation with PET-CT recommended. 3. Bilateral pulmonary nodules measuring up to 13 mm. Some nodules may be inflammatory/infectious in etiology. Primary bronchogenic carcinoma or metastatic disease is not excluded. Evaluation on PET CT. 4. Indeterminate bilateral adrenal nodules as well as and ill-defined hypodense lesion in the dome of the liver. Attention on PET-CT or further evaluation with MRI without and with contrast recommended. 5. Aortic Atherosclerosis (ICD10-I70.0). Electronically Signed   By: Anner Crete M.D.   On: 06/15/2019 19:47  Procedures Procedures (including critical care time)  Medications Ordered in ED Medications  iohexol (OMNIPAQUE) 350 MG/ML injection 100 mL (100 mLs Intravenous Contrast Given 06/15/19 1902)    ED Course  I have reviewed the triage vital signs and the nursing notes.  Pertinent labs & imaging results that were available during my care of the patient were reviewed by me and considered in my medical decision making (see chart for details).  Clinical Course as of Jun 15 2051  Mon Jun 15, 2019  1744 D-Dimer, Quant(!): 0.89 [CA]    Clinical Course User Index [CA] Karie Kirks   MDM Rules/Calculators/A&P                     74 year old male presents the ED due to shortness of breath and central chest pressure x1 week associated with exertion.  Patient was evaluated in urgent care prior to arrival and sent here for further evaluation.  Vitals all within normal limits.  Patient is afebrile, not tachycardic or hypoxic.  Patient in no acute distress and nontoxic-appearing.  Physical exam reassuring.  No lower extremity edema.  Negative Homans sign bilaterally. Will obtain routine labs, troponin, CXR, and EKG to rule out cardiac and pulmonary etiology.   Chest x-ray personally reviewed which is negative for signs of pneumonia, pneumothorax, or widened  mediastinum.  EKG personally reviewed which demonstrates normal sinus rhythm with questionable old lateral infarct. No acute findings. CBC reassuring with no leukocytosis. BMP reassuring with normal renal function, mild hyponatremia at 134 and hyperglycemia at 140 with normal anion gap. No concern for DKA at this time. Initial troponin normal at <2. Will obtain delta troponin to rule out ACS. D-dimer elevated at 0.89. Age adjusted D-dimer above cut-off will obtain CT to rule out PE given his associated calf pain and story concerning for PE.   CTA personally reviewed which demonstrates: 1. No CT evidence of pulmonary embolism.  2. Mediastinal, left supraclavicular, and upper abdominal adenopathy  of indeterminate etiology. Clinical correlation and further  evaluation with PET-CT recommended.  3. Bilateral pulmonary nodules measuring up to 13 mm. Some nodules  may be inflammatory/infectious in etiology. Primary bronchogenic  carcinoma or metastatic disease is not excluded. Evaluation on PET  CT.  4. Indeterminate bilateral adrenal nodules as well as and  ill-defined hypodense lesion in the dome of the liver. Attention on  PET-CT or further evaluation with MRI without and with contrast  recommended.  5. Aortic Atherosclerosis (ICD10-I70.0).   Doubt PE/DVT. Delta troponin flat. Doubt ACS. Suspect shortness of breath could be related to bilateral pulmonary nodules. Discussed results at length with patient and daughter/wife on the phone. Oncology/Heme number given to patient at discharge. Instructed patient go to his cardiology appointment next week and to schedule an appointment with his PCP for further evaluation. Patient notes he feels comfortable going home, but requested some pain medication for his arthritis in his right knee given his current medication does not work. Will discharge with pain medication. Discussed case with Dr. Rogene Houston who evaluated patient at bedside and agrees with assessment  and plan. Strict ED precautions discussed with patient. Patient states understanding and agrees to plan. Patient discharged home in no acute distress and stable vitals.  Final Clinical Impression(s) / ED Diagnoses Final diagnoses:  Shortness of breath  Nonspecific chest pain    Rx / DC Orders ED Discharge Orders         Ordered    HYDROcodone-acetaminophen (NORCO/VICODIN) 5-325 MG  tablet  Every 6 hours PRN     06/15/19 2020           Karie Kirks 06/15/19 2143    Fredia Sorrow, MD 06/16/19 1540

## 2019-06-15 NOTE — ED Provider Notes (Signed)
Medical screening examination/treatment/procedure(s) were conducted as a shared visit with non-physician practitioner(s) and myself.  I personally evaluated the patient during the encounter.      Patient seen by me along with physician assistant.  Patient presenting with a complaint of about 1 week of exertional shortness of breath as well as chest discomfort and some mild nausea.  The chest discomfort substernal.  Patient when he rest for about 10 to 15 minutes all symptoms resolved.  Is been starting to happen more frequently.  Also complaining of discomfort in his right calf area that radiates towards his right hip.  This does not get worse with walking.  This is more of a problem at night.  Patient also states that he feels as if he is getting palpitations when this occurs and that his heart rate gets fast.  Results for orders placed or performed during the hospital encounter of [id number]  Basic metabolic panel  Result Value Ref Range   Sodium 134 (L) 135 - 145 mmol/L   Potassium 3.8 3.5 - 5.1 mmol/L   Chloride 101 98 - 111 mmol/L   CO2 22 22 - 32 mmol/L   Glucose, Bld 140 (H) 70 - 99 mg/dL   BUN 17 8 - 23 mg/dL   Creatinine, Ser 1.02 0.61 - 1.24 mg/dL   Calcium 8.6 (L) 8.9 - 10.3 mg/dL   GFR calc non Af Amer >60 >60 mL/min   GFR calc Af Amer >60 >60 mL/min   Anion gap 11 5 - 15  CBC  Result Value Ref Range   WBC 9.1 4.0 - 10.5 K/uL   RBC 4.26 4.22 - 5.81 MIL/uL   Hemoglobin 12.4 (L) 13.0 - 17.0 g/dL   HCT 41.0 39.0 - 52.0 %   MCV 96.2 80.0 - 100.0 fL   MCH 29.1 26.0 - 34.0 pg   MCHC 30.2 30.0 - 36.0 g/dL   RDW 14.5 11.5 - 15.5 %   Platelets 345 150 - 400 K/uL   nRBC 0.0 0.0 - 0.2 %  D-dimer, quantitative (not at [facility])  Result Value Ref Range   D-Dimer, Quant 0.89 (H) 0.00 - 0.50 ug/mL-FEU  Troponin I (High Sensitivity)  Result Value Ref Range   Troponin I (High Sensitivity) <2 <18 ng/L   DG Chest 2 View  Result Date: 06/15/2019 CLINICAL DATA:  Chest pain. Palpitations.  Shortness of breath. EXAM: CHEST - 2 VIEW COMPARISON:  04/03/2015 FINDINGS: The cardiomediastinal contours are normal. Unchanged elevation of right hemidiaphragm. Pulmonary vasculature is normal. No consolidation, pleural effusion, or pneumothorax. No acute osseous abnormalities are seen. IMPRESSION: No acute chest findings. Electronically Signed   By: Keith Rake M.D.   On: 06/15/2019 16:53    Chest x-ray without any acute findings.  EKG with some questionable old lateral infarct.  But nothing acute.  Patient's initial troponin was less than 2.  D-dimer was elevated however correcting for age is not significant elevated.  So but borderline.  But based on patient's story per tickly with the right leg pain and the exertional shortness of breath and chest discomfort and some mild nausea will go ahead and proceed with CT angio to rule out pulmonary embolus.  Otherwise some concern possibly for a atypical angina type picture.  Possibly could be unstable since it has been getting more pronounced and occurring more frequently in the last 2 days.  Patient has a delta troponin that is due now.  We will get the CT angio.  If negative then  no pulmonary embolus is ruled out however deep vein thrombosis not completely ruled out.  Patient's right calf pain also not consistent with claudication.  Patient will probably require admission either way.  If PE is ruled out then probably needs admission for like an unstable angina picture.  Though not classic in nature.   Fredia Sorrow, MD 06/15/19 [phone]

## 2019-06-15 NOTE — Discharge Instructions (Addendum)
As discussed, your labs were reassuring today. You are not having a heart attack. You scan showed some masses and lymph nodes that could be cancerous. I have included the number of the oncologist here in the hospital. Call tomorrow to schedule an appointment. Go to your scheduled cardiology appointment for further evaluation of your chest pain. Also, call your PCP tomorrow to schedule an appointment. I have sent you home with some pain medication for your knee pain. Take as prescribed. Return to the ER for worsening chest pain or shortness of breath.

## 2019-06-15 NOTE — ED Notes (Signed)
Patient is being discharged from the [facility] and sent to the Emergency Department via POV. Per HW, patient is stable but in need of higher level of care due to palpitations and SOB. Patient is aware and verbalizes understanding of plan of care.  Vitals:   06/15/19 1540  BP: (!) 148/89  Pulse: 96  Resp: 18  Temp: 97.9 F (36.6 C)  SpO2: 96%

## 2019-06-15 NOTE — Discharge Instructions (Signed)
Please go to ED Workup for unstable angina, possible PE

## 2019-06-16 MED FILL — Hydrocodone-Acetaminophen Tab 5-325 MG: ORAL | Qty: 6 | Status: AC

## 2019-06-18 DIAGNOSIS — R0602 Shortness of breath: Secondary | ICD-10-CM | POA: Diagnosis not present

## 2019-06-18 DIAGNOSIS — R002 Palpitations: Secondary | ICD-10-CM | POA: Diagnosis not present

## 2019-06-18 DIAGNOSIS — M79604 Pain in right leg: Secondary | ICD-10-CM | POA: Diagnosis not present

## 2019-06-18 DIAGNOSIS — K219 Gastro-esophageal reflux disease without esophagitis: Secondary | ICD-10-CM | POA: Diagnosis not present

## 2019-06-18 DIAGNOSIS — M545 Low back pain: Secondary | ICD-10-CM | POA: Diagnosis not present

## 2019-06-24 ENCOUNTER — Ambulatory Visit: Payer: Medicare Other | Admitting: Cardiovascular Disease

## 2019-06-24 ENCOUNTER — Other Ambulatory Visit (HOSPITAL_COMMUNITY)
Admission: RE | Admit: 2019-06-24 | Discharge: 2019-06-24 | Disposition: A | Discharge status: Home / Self Care | Payer: Medicare Other | Source: Ambulatory Visit | Attending: Cardiovascular Disease | Admitting: Cardiovascular Disease

## 2019-06-24 ENCOUNTER — Encounter: Payer: Self-pay | Admitting: Cardiovascular Disease

## 2019-06-24 ENCOUNTER — Ambulatory Visit (INDEPENDENT_AMBULATORY_CARE_PROVIDER_SITE_OTHER): Payer: Medicare Other

## 2019-06-24 ENCOUNTER — Other Ambulatory Visit: Payer: Self-pay

## 2019-06-24 VITALS — BP 106/71 | HR 95 | Temp 98.0°F | Ht 73.0 in | Wt 215.0 lb

## 2019-06-24 DIAGNOSIS — R0609 Other forms of dyspnea: Secondary | ICD-10-CM

## 2019-06-24 DIAGNOSIS — R599 Enlarged lymph nodes, unspecified: Secondary | ICD-10-CM

## 2019-06-24 DIAGNOSIS — R002 Palpitations: Secondary | ICD-10-CM

## 2019-06-24 DIAGNOSIS — R591 Generalized enlarged lymph nodes: Secondary | ICD-10-CM

## 2019-06-24 DIAGNOSIS — R918 Other nonspecific abnormal finding of lung field: Secondary | ICD-10-CM

## 2019-06-24 DIAGNOSIS — R0600 Dyspnea, unspecified: Secondary | ICD-10-CM

## 2019-06-24 LAB — MAGNESIUM: Magnesium: 1.8 mg/dL (ref 1.7–2.4)

## 2019-06-24 NOTE — Patient Instructions (Signed)
Medication Instructions:  Your physician recommends that you continue on your current medications as directed. Please refer to the Current Medication list given to you today.  *If you need a refill on your cardiac medications before your next appointment, please call your pharmacy*   Lab Work: Magnesium  If you have labs (blood work) drawn today and your tests are completely normal, you will receive your results only by: Marland Kitchen MyChart Message (if you have MyChart) OR . A paper copy in the mail If you have any lab test that is abnormal or we need to change your treatment, we will call you to review the results.   Testing/Procedures: Your physician has requested that you have an echocardiogram. Echocardiography is a painless test that uses sound waves to create images of your heart. It provides your doctor with information about the size and shape of your heart and how well your heart's chambers and valves are working. This procedure takes approximately one hour. There are no restrictions for this procedure.   Your physician has recommended that you wear an event monitor. Event monitors are medical devices that record the heart's electrical activity. Doctors most often [place] these monitors to diagnose arrhythmias. Arrhythmias are problems with the speed or rhythm of the heartbeat. The monitor is a small, portable device. You can wear one while you do your normal daily activities. This is usually used to diagnose what is causing palpitations/syncope (passing out).  Your physician has requested that you have a lexiscan myoview. For further information please visit [email or web address]. Please follow instruction sheet, as given.     Follow-Up: At [facility], you and your health needs are our priority.  As part of our continuing mission to provide you with exceptional heart care, we have created designated Provider Care Teams.  These Care Teams include your primary Cardiologist (physician) and  Advanced Practice Providers (APPs -  Physician Assistants and Nurse Practitioners) who all work together to provide you with the care you need, when you need it.  We recommend signing up for the patient portal called "MyChart".  Sign up information is provided on this After Visit Summary.  MyChart is used to connect with patients for Virtual Visits (Telemedicine).  Patients are able to view lab/test results, encounter notes, upcoming appointments, etc.  Non-urgent messages can be sent to your provider as well.   To learn more about what you can do with MyChart, go to [email or web address].    Your next appointment:   3 month(s)  The format for your next appointment:   Virtual Visit   Provider:   Kate Sable, MD   Other Instructions None         Thank you for choosing [facility] !

## 2019-06-24 NOTE — Progress Notes (Signed)
CARDIOLOGY CONSULT NOTE  Patient ID: Richard Conway MRN: [id number] DOB/AGE: 1945/05/10 74 y.o.  Admit date: (Not on file) Primary Physician: Celene Squibb, MD  Reason for Consultation: Palpitations, chest discomfort  HPI: Richard Conway is a 74 y.o. male who is being seen today for the evaluation of palpitations and chest discomfort at the request of Celene Squibb, MD.   He was evaluated in the ED on 06/15/2019 for shortness of breath and chest discomfort.  He also complained of palpitations at that time.  I have personally reviewed all documentation, labs, radiographic and cardiovascular studies, and independently interpreted all ECG's.  I reviewed labs and renal function, potassium, and sodium were normal.  Hemoglobin was mildly low at 12.4.  D-dimer is mildly elevated at 0.89.  Troponins were normal.  I reviewed CT angiography of the chest which demonstrated coronary vascular calcification primarily involving the LAD.  The ascending aorta measured approximately 4 cm and on axial images measured approximately 4.4 cm.  He was noted to have mediastinal, left supraclavicular, and upper abdominal adenopathy of indeterminate etiology.  There were bilateral pulmonary nodules as well.  There were indeterminate bilateral adrenal nodules.  I personally reviewed the ECG which demonstrated sinus rhythm with PVCs and small Q waves in precordial leads and high lateral leads.  He is here with his wife.  For the past 2 weeks he has been experiencing palpitations when he walks from his down to the bathroom. He also has exertional dyspnea. He very seldom has some mild throat pain with exertion. He denies leg swelling, orthopnea, and paroxysmal nocturnal dyspnea.  He has arthritis of the back and arthritic pain in the right hip region.   Allergies  Allergen Reactions  . Advil [Ibuprofen] Other (See Comments)    Face and body numbness  . Nyquil Multi-Symptom  [Pseudoeph-Doxylamine-Dm-Apap] Other (See Comments)    Increase in bp     Current Outpatient Medications  Medication Sig Dispense Refill  . acetaminophen (TYLENOL) 325 MG tablet Take 650 mg by mouth every 4 (four) hours as needed for mild pain.    Marland Kitchen gabapentin (NEURONTIN) 300 MG capsule Take 300 mg by mouth 3 (three) times daily.    . Glucosamine HCl (GLUCOSAMINE PO) Take 1 tablet by mouth daily.    Marland Kitchen HYDROcodone-acetaminophen (NORCO/VICODIN) 5-325 MG tablet Take 1 tablet by mouth every 6 (six) hours as needed. 6 tablet 0  . lisinopril-hydrochlorothiazide (ZESTORETIC) 10-12.5 MG tablet Take 1 tablet by mouth daily.    Marland Kitchen LORazepam (ATIVAN) 0.5 MG tablet Take 0.5 mg by mouth at bedtime.    . meclizine (ANTIVERT) 25 MG tablet Take 1 tablet (25 mg total) by mouth every 6 (six) hours as needed for dizziness. (Patient taking differently: Take 25 mg by mouth 2 (two) times daily. ) 20 tablet 0  . meloxicam (MOBIC) 15 MG tablet Take 15 mg by mouth daily.     . Multiple Vitamin (MULTIVITAMIN WITH MINERALS) TABS tablet Take 1 tablet by mouth daily.    . Omega-3 Fatty Acids (FISH OIL) 500 MG CAPS Take 1 capsule by mouth 2 (two) times daily.    Marland Kitchen omeprazole (PRILOSEC) 40 MG capsule Take 40 mg by mouth daily.    . pantoprazole (PROTONIX) 40 MG tablet Take 40 mg by mouth daily.    . traMADol (ULTRAM) 50 MG tablet Take 50 mg by mouth every 6 (six) hours as needed.    . benazepril (LOTENSIN) 10 MG tablet  Take 10 mg by mouth daily.    Marland Kitchen HYDROcodone-acetaminophen (NORCO/VICODIN) 5-325 MG tablet Take 1 tablet by mouth 2 (two) times daily as needed.    Marland Kitchen PRESCRIPTION MEDICATION Inject 1 each as directed every 6 (six) months. Steroid injection     No current facility-administered medications for this visit.    Past Medical History:  Diagnosis Date  . Arthritis   . Hypertension   . Renal disorder    kidney stones    Past Surgical History:  Procedure Laterality Date  . CHOLECYSTECTOMY    . KNEE SURGERY       Social History   Socioeconomic History  . Marital status: Married    Spouse name: Not on file  . Number of children: Not on file  . Years of education: Not on file  . Highest education level: Not on file  Occupational History  . Not on file  Tobacco Use  . Smoking status: Never Smoker  . Smokeless tobacco: Never Used  Substance and Sexual Activity  . Alcohol use: No  . Drug use: No  . Sexual activity: Not on file  Other Topics Concern  . Not on file  Social History Narrative  . Not on file   Social Determinants of Health   Financial Resource Strain:   . Difficulty of Paying Living Expenses:   Food Insecurity:   . Worried About Charity fundraiser in the Last Year:   . Arboriculturist in the Last Year:   Transportation Needs:   . Film/video editor (Medical):   Marland Kitchen Lack of Transportation (Non-Medical):   Physical Activity:   . Days of Exercise per Week:   . Minutes of Exercise per Session:   Stress:   . Feeling of Stress :   Social Connections:   . Frequency of Communication with Friends and Family:   . Frequency of Social Gatherings with Friends and Family:   . Attends Religious Services:   . Active Member of Clubs or Organizations:   . Attends Archivist Meetings:   Marland Kitchen Marital Status:   Intimate Partner Violence:   . Fear of Current or Ex-Partner:   . Emotionally Abused:   Marland Kitchen Physically Abused:   . Sexually Abused:      No family history of premature CAD in 1st degree relatives.  Current Meds  Medication Sig  . acetaminophen (TYLENOL) 325 MG tablet Take 650 mg by mouth every 4 (four) hours as needed for mild pain.  Marland Kitchen gabapentin (NEURONTIN) 300 MG capsule Take 300 mg by mouth 3 (three) times daily.  . Glucosamine HCl (GLUCOSAMINE PO) Take 1 tablet by mouth daily.  Marland Kitchen HYDROcodone-acetaminophen (NORCO/VICODIN) 5-325 MG tablet Take 1 tablet by mouth every 6 (six) hours as needed.  Marland Kitchen lisinopril-hydrochlorothiazide (ZESTORETIC) 10-12.5 MG tablet  Take 1 tablet by mouth daily.  Marland Kitchen LORazepam (ATIVAN) 0.5 MG tablet Take 0.5 mg by mouth at bedtime.  . meclizine (ANTIVERT) 25 MG tablet Take 1 tablet (25 mg total) by mouth every 6 (six) hours as needed for dizziness. (Patient taking differently: Take 25 mg by mouth 2 (two) times daily. )  . meloxicam (MOBIC) 15 MG tablet Take 15 mg by mouth daily.   . Multiple Vitamin (MULTIVITAMIN WITH MINERALS) TABS tablet Take 1 tablet by mouth daily.  . Omega-3 Fatty Acids (FISH OIL) 500 MG CAPS Take 1 capsule by mouth 2 (two) times daily.  Marland Kitchen omeprazole (PRILOSEC) 40 MG capsule Take 40 mg by mouth daily.  Marland Kitchen  pantoprazole (PROTONIX) 40 MG tablet Take 40 mg by mouth daily.  . traMADol (ULTRAM) 50 MG tablet Take 50 mg by mouth every 6 (six) hours as needed.      Review of systems complete and found to be negative unless listed above in HPI    Physical exam Blood pressure 106/71, pulse 95, temperature 98 F (36.7 C), height 6\' 1"  (1.854 m). General: NAD Neck: No JVD, no thyromegaly or thyroid nodule.  Lungs: Clear to auscultation bilaterally with normal respiratory effort. CV: Nondisplaced PMI. Regular rate and rhythm, normal S1/S2, no S3/S4, no murmur.  No peripheral edema.  No carotid bruit.    Abdomen: Soft, nontender, no distention.  Skin: Intact without lesions or rashes.  Neurologic: Alert and oriented x 3.  Psych: Normal affect. Extremities: No clubbing or cyanosis.  HEENT: Normal.   ECG: Most recent ECG reviewed.   Labs: Lab Results  Component Value Date/Time   K 3.8 06/15/2019 04:54 PM   BUN 17 06/15/2019 04:54 PM   CREATININE 1.02 06/15/2019 04:54 PM   ALT 20 06/15/2019 06:54 PM   HGB 12.4 (L) 06/15/2019 04:54 PM     Lipids: No results found for: LDLCALC, LDLDIRECT, CHOL, TRIG, HDL      ASSESSMENT AND PLAN:   1.  Palpitations: PVCs seen on ECG at the time of ED evaluation. I will obtain a 1 week event monitor. Potassium is normal. I will check a magnesium level. I will also  obtain an echocardiogram to evaluate cardiac structure and function.  2.  Chest discomfort and exertional dyspnea: Chest CT reviewed above with adenopathy and pulmonary nodules.  I will obtain a Lexiscan Myoview to rule out ischemic heart disease.  3. Adenopathy/pulmonary nodules: This may also be the etiology of his exertional dyspnea. He will need a PET scan. I will defer follow-up on this to his PCP.    Disposition: Follow up in 3 months virtual visit  Signed: Kate Sable, M.D., F.A.C.C.  06/24/2019, 1:27 PM

## 2019-06-30 ENCOUNTER — Other Ambulatory Visit (HOSPITAL_COMMUNITY): Payer: Self-pay | Admitting: Internal Medicine

## 2019-06-30 DIAGNOSIS — R918 Other nonspecific abnormal finding of lung field: Secondary | ICD-10-CM

## 2019-07-06 ENCOUNTER — Emergency Department (HOSPITAL_COMMUNITY)
Admission: EM | Admit: 2019-07-06 | Discharge: 2019-07-06 | Disposition: A | Discharge status: Home / Self Care | Payer: Medicare Other | Attending: Emergency Medicine | Admitting: Emergency Medicine

## 2019-07-06 ENCOUNTER — Other Ambulatory Visit: Payer: Self-pay

## 2019-07-06 ENCOUNTER — Emergency Department (HOSPITAL_COMMUNITY): Payer: Medicare Other

## 2019-07-06 ENCOUNTER — Encounter (HOSPITAL_COMMUNITY): Payer: Self-pay

## 2019-07-06 DIAGNOSIS — Z79899 Other long term (current) drug therapy: Secondary | ICD-10-CM | POA: Diagnosis not present

## 2019-07-06 DIAGNOSIS — R590 Localized enlarged lymph nodes: Secondary | ICD-10-CM | POA: Diagnosis not present

## 2019-07-06 DIAGNOSIS — R110 Nausea: Secondary | ICD-10-CM | POA: Diagnosis not present

## 2019-07-06 DIAGNOSIS — I10 Essential (primary) hypertension: Secondary | ICD-10-CM | POA: Insufficient documentation

## 2019-07-06 DIAGNOSIS — R911 Solitary pulmonary nodule: Secondary | ICD-10-CM | POA: Insufficient documentation

## 2019-07-06 DIAGNOSIS — Z87442 Personal history of urinary calculi: Secondary | ICD-10-CM | POA: Insufficient documentation

## 2019-07-06 DIAGNOSIS — R1030 Lower abdominal pain, unspecified: Secondary | ICD-10-CM | POA: Insufficient documentation

## 2019-07-06 DIAGNOSIS — R0602 Shortness of breath: Secondary | ICD-10-CM | POA: Diagnosis not present

## 2019-07-06 DIAGNOSIS — R109 Unspecified abdominal pain: Secondary | ICD-10-CM | POA: Diagnosis not present

## 2019-07-06 LAB — CBC
HCT: 38.1 % — ABNORMAL LOW (ref 39.0–52.0)
Hemoglobin: 11.9 g/dL — ABNORMAL LOW (ref 13.0–17.0)
MCH: 28.4 pg (ref 26.0–34.0)
MCHC: 31.2 g/dL (ref 30.0–36.0)
MCV: 90.9 fL (ref 80.0–100.0)
Platelets: 554 10*3/uL — ABNORMAL HIGH (ref 150–400)
RBC: 4.19 MIL/uL — ABNORMAL LOW (ref 4.22–5.81)
RDW: 14.4 % (ref 11.5–15.5)
WBC: 12.8 10*3/uL — ABNORMAL HIGH (ref 4.0–10.5)
nRBC: 0 % (ref 0.0–0.2)

## 2019-07-06 LAB — LIPASE, BLOOD: Lipase: 19 U/L (ref 11–51)

## 2019-07-06 LAB — DIFFERENTIAL
Abs Immature Granulocytes: 0.04 10*3/uL (ref 0.00–0.07)
Basophils Absolute: 0 10*3/uL (ref 0.0–0.1)
Basophils Relative: 0 %
Eosinophils Absolute: 0.3 10*3/uL (ref 0.0–0.5)
Eosinophils Relative: 3 %
Immature Granulocytes: 0 %
Lymphocytes Relative: 15 %
Lymphs Abs: 1.8 10*3/uL (ref 0.7–4.0)
Monocytes Absolute: 1 10*3/uL (ref 0.1–1.0)
Monocytes Relative: 8 %
Neutro Abs: 9.1 10*3/uL — ABNORMAL HIGH (ref 1.7–7.7)
Neutrophils Relative %: 74 %

## 2019-07-06 LAB — COMPREHENSIVE METABOLIC PANEL
ALT: 22 U/L (ref 0–44)
AST: 30 U/L (ref 15–41)
Albumin: 3.5 g/dL (ref 3.5–5.0)
Alkaline Phosphatase: 69 U/L (ref 38–126)
Anion gap: 12 (ref 5–15)
BUN: 18 mg/dL (ref 8–23)
CO2: 25 mmol/L (ref 22–32)
Calcium: 8.9 mg/dL (ref 8.9–10.3)
Chloride: 95 mmol/L — ABNORMAL LOW (ref 98–111)
Creatinine, Ser: 1.09 mg/dL (ref 0.61–1.24)
GFR calc Af Amer: >60 mL/min (ref >60)
GFR calc non Af Amer: >60 mL/min (ref >60)
Glucose, Bld: 118 mg/dL — ABNORMAL HIGH (ref 70–99)
Potassium: 4.1 mmol/L (ref 3.5–5.1)
Sodium: 132 mmol/L — ABNORMAL LOW (ref 135–145)
Total Bilirubin: 0.3 mg/dL (ref 0.3–1.2)
Total Protein: 8.1 g/dL (ref 6.5–8.1)

## 2019-07-06 LAB — POC OCCULT BLOOD, ED: Fecal Occult Bld: POSITIVE — AB

## 2019-07-06 MED ORDER — IOHEXOL 300 MG/ML  SOLN
100.0000 mL | Freq: Once | INTRAMUSCULAR | Status: AC | PRN
  Administered 2019-07-06: 100 mL via INTRAVENOUS

## 2019-07-06 MED ORDER — ACETAMINOPHEN 325 MG PO TABS
650.0000 mg | ORAL_TABLET | Freq: Once | ORAL | Status: AC
  Administered 2019-07-06: 650 mg via ORAL
  Filled 2019-07-06: qty 2

## 2019-07-06 MED ORDER — HYDROCODONE-ACETAMINOPHEN 5-325 MG PO TABS: 1.0000 | ORAL_TABLET | ORAL | 0 refills | Status: DC | PRN

## 2019-07-06 MED ORDER — SODIUM CHLORIDE 0.9% FLUSH
3.0000 mL | Freq: Once | INTRAVENOUS | Status: AC
  Administered 2019-07-06: 3 mL via INTRAVENOUS

## 2019-07-06 MED ORDER — OXYCODONE-ACETAMINOPHEN 5-325 MG PO TABS: 1.0000 | ORAL_TABLET | ORAL | 0 refills | Status: DC | PRN

## 2019-07-06 NOTE — Discharge Instructions (Addendum)
Your lab tests are stable tonight but there is a small amount of blood in your stool which will need further evaluation.  Your CT scan shows some small nodules and swollen lymph nodes that can be a sign of a cancer so it will be important for you to proceed with the PET scan that has been arranged by Dr. Nevada Crane next week.  You can use the pain medicine as needed - this will make you constipated.  It is important that you continue taking a stool softener to help with this side effect.  I have spoken with Dr. Delton Coombes, our cancer specialist here.  His office will be calling you to arrange an office visit.   You may try taking the percocet samples given in place of your hydrocodone (DO NOT TAKE TOGETHER) for better pain relief.

## 2019-07-06 NOTE — ED Triage Notes (Signed)
Pt is having lower abdominal pain. Pain is worse with eating. States he has been taking Pepto Bismol. Stools are now black. He took a laxative this morning for constipation.

## 2019-07-06 NOTE — ED Notes (Signed)
Patient taken to ct scan

## 2019-07-06 NOTE — ED Provider Notes (Signed)
[facility] EMERGENCY DEPARTMENT Provider Note   CSN: [id number] Arrival date & time: 07/06/19  [id number]     History Chief Complaint  Patient presents with  . Abdominal Pain    Richard Conway is a 74 y.o. male with a history of hypertension, renal disorder, arthritis presenting for evaluation of low abdominal pain.  He describes nearly constant lower suprapubic pain, waxing and waning in character, usually worsens about an hour after eating a meal.  He also endorses constipation and his stools have been very dark, almost black this week but also states that he has been taking Pepto-Bismol.  He denies bright red blood per rectum.  He does have some occasional nausea, no emesis, no urinary complaints.  Also no fevers or chills.  He has had reduced appetite secondary to the symptoms.  He denies back or flank pain.  Of note, he was seen here on March 15 for chest pain and shortness of breath and CT chest imaging was revealing for pulmonary nodules and adenopathy.  He is scheduled for a PET scan next week arranged by his PCP.  The history is provided by the patient and the spouse.       Past Medical History:  Diagnosis Date  . Arthritis   . Hypertension   . Renal disorder    kidney stones    There are no problems to display for this patient.   Past Surgical History:  Procedure Laterality Date  . CHOLECYSTECTOMY    . KNEE SURGERY         No family history on file.  Social History   Tobacco Use  . Smoking status: Never Smoker  . Smokeless tobacco: Never Used  Substance Use Topics  . Alcohol use: No  . Drug use: No    Home Medications Prior to Admission medications   Medication Sig Start Date End Date Taking? Authorizing Provider  acetaminophen (TYLENOL) 325 MG tablet Take 650 mg by mouth every 4 (four) hours as needed for mild pain.    [provider]  benazepril (LOTENSIN) 10 MG tablet Take 10 mg by mouth daily.    [provider]  gabapentin  (NEURONTIN) 300 MG capsule Take 300 mg by mouth 3 (three) times daily. 04/27/19   [provider]  Glucosamine HCl (GLUCOSAMINE PO) Take 1 tablet by mouth daily.    [provider]  HYDROcodone-acetaminophen (NORCO/VICODIN) 5-325 MG tablet Take 1 tablet by mouth 2 (two) times daily as needed. 05/21/19   [provider]  HYDROcodone-acetaminophen (NORCO/VICODIN) 5-325 MG tablet Take 1 tablet by mouth every 6 (six) hours as needed. 06/15/19   Suzy Bouchard, PA-C  lisinopril-hydrochlorothiazide (ZESTORETIC) 10-12.5 MG tablet Take 1 tablet by mouth daily. 05/04/19   [provider]  LORazepam (ATIVAN) 0.5 MG tablet Take 0.5 mg by mouth at bedtime.    [provider]  meclizine (ANTIVERT) 25 MG tablet Take 1 tablet (25 mg total) by mouth every 6 (six) hours as needed for dizziness. Patient taking differently: Take 25 mg by mouth 2 (two) times daily.  11/01/12   Riki Altes, MD  meloxicam (MOBIC) 15 MG tablet Take 15 mg by mouth daily.     [provider]  Multiple Vitamin (MULTIVITAMIN WITH MINERALS) TABS tablet Take 1 tablet by mouth daily.    [provider]  Omega-3 Fatty Acids (FISH OIL) 500 MG CAPS Take 1 capsule by mouth 2 (two) times daily.    [provider]  omeprazole (Erma)  40 MG capsule Take 40 mg by mouth daily. 05/11/19   [provider]  oxyCODONE-acetaminophen (PERCOCET/ROXICET) 5-325 MG tablet Take 1 tablet by mouth every 4 (four) hours as needed for severe pain. 07/06/19   Evalee Jefferson, PA-C  pantoprazole (PROTONIX) 40 MG tablet Take 40 mg by mouth daily. 06/08/19   [provider]  PRESCRIPTION MEDICATION Inject 1 each as directed every 6 (six) months. Steroid injection    [provider]  traMADol (ULTRAM) 50 MG tablet Take 50 mg by mouth every 6 (six) hours as needed. 06/08/19   [provider]    Allergies    Advil [ibuprofen] and Nyquil multi-symptom  [pseudoeph-doxylamine-dm-apap]  Review of Systems   Review of Systems  Constitutional: Positive for appetite change. Negative for fever and unexpected weight change.  HENT: Negative for congestion and sore throat.   Eyes: Negative.   Respiratory: Positive for shortness of breath. Negative for cough and chest tightness.        Chronic shortness of breath, not worsened today  Cardiovascular: Negative for chest pain and palpitations.  Gastrointestinal: Positive for abdominal distention, abdominal pain and nausea. Negative for vomiting.  Genitourinary: Negative.   Musculoskeletal: Negative for arthralgias, joint swelling and neck pain.  Skin: Negative.  Negative for rash and wound.  Neurological: Negative for dizziness, weakness, light-headedness, numbness and headaches.  Psychiatric/Behavioral: Negative.     Physical Exam Updated Vital Signs BP (!) 102/92   Pulse 83   Temp 99.1 F (37.3 C) (Oral)   Resp 18   Ht 6\' 1"  (1.854 m)   Wt 101.2 kg   SpO2 97%   BMI 29.42 kg/m   Physical Exam Vitals and nursing note reviewed.  Constitutional:      Appearance: He is well-developed.  HENT:     Head: Normocephalic and atraumatic.  Eyes:     Conjunctiva/sclera: Conjunctivae normal.  Cardiovascular:     Rate and Rhythm: Normal rate and regular rhythm.     Heart sounds: Normal heart sounds.  Pulmonary:     Effort: Pulmonary effort is normal.     Breath sounds: Normal breath sounds. No wheezing.  Abdominal:     General: Bowel sounds are normal.     Palpations: Abdomen is soft.     Tenderness: There is abdominal tenderness in the suprapubic area.  Musculoskeletal:        General: Normal range of motion.     Cervical back: Normal range of motion.  Skin:    General: Skin is warm and dry.  Neurological:     Mental Status: He is alert.     ED Results / Procedures / Treatments   Labs (all labs ordered are listed, but only abnormal results are displayed) Labs Reviewed    COMPREHENSIVE METABOLIC PANEL - Abnormal; Notable for the following components:      Result Value   Sodium 132 (*)    Chloride 95 (*)    Glucose, Bld 118 (*)    All other components within normal limits  CBC - Abnormal; Notable for the following components:   WBC 12.8 (*)    RBC 4.19 (*)    Hemoglobin 11.9 (*)    HCT 38.1 (*)    Platelets 554 (*)    All other components within normal limits  DIFFERENTIAL - Abnormal; Notable for the following components:   Neutro Abs 9.1 (*)    All other components within normal limits  POC OCCULT BLOOD, ED - Abnormal; Notable for the  following components:   Fecal Occult Bld POSITIVE (*)    All other components within normal limits  LIPASE, BLOOD  URINALYSIS, ROUTINE W REFLEX MICROSCOPIC  CEA    EKG None  Radiology CT ABDOMEN PELVIS W CONTRAST  Result Date: 07/06/2019 CLINICAL DATA:  Lower abdominal pain. EXAM: CT ABDOMEN AND PELVIS WITH CONTRAST TECHNIQUE: Multidetector CT imaging of the abdomen and pelvis was performed using the standard protocol following bolus administration of intravenous contrast. CONTRAST:  121mL OMNIPAQUE IOHEXOL 300 MG/ML  SOLN COMPARISON:  April 23, 2005 FINDINGS: Lower chest: Multiple 2 mm and 3 mm noncalcified lung nodules are seen within the posterior aspect of the left lung base. 4 mm and 8 mm noncalcified lung nodules are seen within the right middle lobe. These are not identified on the prior study. An additional 6 mm noncalcified anterolateral right middle lobe lung nodule is seen (axial CT image 21, CT series number 4). Multiple pleural based areas of low attenuation are seen along the posterior aspect of the right lung base. The largest measures approximately 1.8 cm in length (axial CT image 55, CT series number 4). A similar appearing 1.3 cm pleural based area of low attenuation is seen within the posterolateral aspect of the left lung base (axial CT image 33, CT series number 4). Hepatobiliary: A 2.1 cm x 1.6 cm  lobulated focus of low attenuation is seen within the anterior aspect of the right lobe of the liver (axial CT image 14, CT series number 2). An additional 0.4 cm diameter focus of low attenuation is seen in this region. Status post cholecystectomy. No biliary dilatation. Pancreas: Unremarkable. No pancreatic ductal dilatation. Multiple subcentimeter mesenteric lymph nodes are seen anterior and inferior to the body of the pancreas. A mild amount of surrounding nonspecific mesenteric inflammatory fat stranding is seen. Spleen: Normal in size without focal abnormality. Adrenals/Urinary Tract: A 1.9 cm x 1.6 cm isodense right adrenal mass is seen. An additional 2.4 cm x 1.7 cm low-attenuation right adrenal mass is noted. These are seen on the prior study and mildly increased in size on the current exam. Kidneys are normal, without renal calculi, focal lesion, or hydronephrosis. Bladder is unremarkable. Stomach/Bowel: A 2.7 cm x 2.0 cm low-attenuation soft tissue mass is seen within the posterior aspect of the gastric fundus (axial CT image 26, CT series number 2). A portion of this mass contains fat (approximately -29.67 Hounsfield units) and is stable in appearance when compared to the prior study. Appendix appears normal. No evidence of bowel wall thickening, distention, or inflammatory changes. Noninflamed diverticula are seen within the proximal sigmoid colon. Multiple tiny soft tissue nodules are seen within the mesentery of the anterior and lateral aspects of the mid to upper abdomen on the left. An additional 2.4 cm x 1.6 cm area of soft tissue attenuation is seen within the lateral aspect of the left lower quadrant, posterior to the junction of the descending colon and sigmoid colon. Numerous tiny mesenteric soft tissue nodules are also seen within the pelvis. Vascular/Lymphatic: Moderate severity aortic atherosclerosis. No enlarged abdominal or pelvic lymph nodes. Reproductive: The prostate gland is mildly  enlarged. Mild to moderate severity prostate gland calcification is also seen. Other: There is a mild amount of para-aortic and aortocaval lymphadenopathy. A trace amount of posterior pelvic free fluid is seen. Musculoskeletal: Multilevel degenerative changes seen throughout the lumbar spine. IMPRESSION: 1. Stable 2.7 cm x 2.0 cm low-attenuation, partially fat attenuation soft tissue mass within the posterior aspect of the  gastric fundus. 2. Numerous tiny soft tissue nodules within the mesentery of the pelvis, mid to upper abdomen on the left and posterior to the junction of the descending colon and sigmoid colon. These findings are concerning for metastatic disease. 3. Foci of low attenuation within the anterior aspect of the right lobe of the liver. While these may represent small hemangiomas, an underlying neoplastic process cannot be excluded. 4. Multiple pleural based areas of low attenuation along the posterior aspect of the right lung base, as well as multiple noncalcified lung nodules in the right middle lobe and left lung base. These findings are concerning for metastatic disease. 5. Mild para-aortic and aortocaval lymphadenopathy. 6. Noninflamed sigmoid diverticulosis. 7. Mild to moderate severity prostate gland calcification. Recommend correlation with PSA. 8. Moderate severity aortic atherosclerosis. Aortic Atherosclerosis (ICD10-I70.0). Electronically Signed   By: Virgina Norfolk M.D.   On: 07/06/2019 20:40    Procedures Procedures (including critical care time)  Medications Ordered in ED Medications  sodium chloride flush (NS) 0.9 % injection 3 mL (3 mLs Intravenous Given 07/06/19 1951)  iohexol (OMNIPAQUE) 300 MG/ML solution 100 mL (100 mLs Intravenous Contrast Given 07/06/19 2010)  acetaminophen (TYLENOL) tablet 650 mg (650 mg Oral Given 07/06/19 2233)    ED Course  I have reviewed the triage vital signs and the nursing notes.  Pertinent labs & imaging results that were available during  my care of the patient were reviewed by me and considered in my medical decision making (see chart for details).    MDM Rules/Calculators/A&P                      Labs and imaging reviewed and discussed with wife including possible diagnosis of cancer.  He is aware of his scheduled PET scan in 1 week.  Discussed findings with Dr. Delton Coombes who will f/u with pt.  Office will reach out to pt for appt time.  Pt was advised of this plan.  CEA added to blood tests per Dr. Tomie China request.  He has been taking hydrocodone, prescribed by his pcp which he states is not relieving pain, frequently wakes at night with low abd pain.  Percocet samples given  With clear instruction to take in place of hydrocodone, not in addition.   The patient appears reasonably screened and/or stabilized for discharge and I doubt any other medical condition or other [facility] requiring further screening, evaluation, or treatment in the ED at this time prior to discharge.   Final Clinical Impression(s) / ED Diagnoses Final diagnoses:  Lower abdominal pain    Rx / DC Orders ED Discharge Orders         Ordered    HYDROcodone-acetaminophen (NORCO/VICODIN) 5-325 MG tablet  Every 4 hours PRN,   Status:  Discontinued     07/06/19 2225    oxyCODONE-acetaminophen (PERCOCET/ROXICET) 5-325 MG tablet  Every 4 hours PRN     07/06/19 2245           Evalee Jefferson, PA-C 07/07/19 0126    Milton Ferguson, MD 07/08/19 1147

## 2019-07-07 MED FILL — Oxycodone w/ Acetaminophen Tab 5-325 MG: ORAL | Qty: 6 | Status: AC

## 2019-07-08 LAB — CEA: CEA: 9.4 ng/mL — ABNORMAL HIGH (ref 0.0–4.7)

## 2019-07-10 ENCOUNTER — Encounter (HOSPITAL_BASED_OUTPATIENT_CLINIC_OR_DEPARTMENT_OTHER)
Admission: RE | Admit: 2019-07-10 | Discharge: 2019-07-10 | Disposition: A | Discharge status: Home / Self Care | Payer: Medicare Other | Source: Ambulatory Visit | Attending: Cardiovascular Disease | Admitting: Cardiovascular Disease

## 2019-07-10 ENCOUNTER — Encounter (HOSPITAL_COMMUNITY)
Admission: RE | Admit: 2019-07-10 | Discharge: 2019-07-10 | Disposition: A | Discharge status: Home / Self Care | Payer: Medicare Other | Source: Ambulatory Visit | Attending: Cardiovascular Disease | Admitting: Cardiovascular Disease

## 2019-07-10 ENCOUNTER — Ambulatory Visit (HOSPITAL_COMMUNITY)
Admission: RE | Admit: 2019-07-10 | Discharge: 2019-07-10 | Disposition: A | Discharge status: Home / Self Care | Payer: Medicare Other | Source: Ambulatory Visit | Attending: Cardiovascular Disease | Admitting: Cardiovascular Disease

## 2019-07-10 ENCOUNTER — Encounter (HOSPITAL_COMMUNITY): Payer: Self-pay

## 2019-07-10 ENCOUNTER — Other Ambulatory Visit: Payer: Self-pay

## 2019-07-10 DIAGNOSIS — R002 Palpitations: Secondary | ICD-10-CM | POA: Insufficient documentation

## 2019-07-10 DIAGNOSIS — R0600 Dyspnea, unspecified: Secondary | ICD-10-CM | POA: Insufficient documentation

## 2019-07-10 DIAGNOSIS — R0609 Other forms of dyspnea: Secondary | ICD-10-CM

## 2019-07-10 LAB — NM MYOCAR MULTI W/SPECT W/WALL MOTION / EF
Peak HR: 100 {beats}/min
Rest HR: 82 {beats}/min

## 2019-07-10 MED ORDER — TECHNETIUM TC 99M TETROFOSMIN IV KIT
10.0000 | PACK | Freq: Once | INTRAVENOUS | Status: AC | PRN
  Administered 2019-07-10: 10:00:00 10.5 via INTRAVENOUS

## 2019-07-10 MED ORDER — SODIUM CHLORIDE FLUSH 0.9 % IV SOLN
INTRAVENOUS | Status: AC
  Administered 2019-07-10: 12:00:00 10 mL via INTRAVENOUS
  Filled 2019-07-10: qty 10

## 2019-07-10 MED ORDER — TECHNETIUM TC 99M TETROFOSMIN IV KIT
30.0000 | PACK | Freq: Once | INTRAVENOUS | Status: AC | PRN
  Administered 2019-07-10: 30.6 via INTRAVENOUS

## 2019-07-10 MED ORDER — REGADENOSON 0.4 MG/5ML IV SOLN
INTRAVENOUS | Status: AC
  Administered 2019-07-10: 12:00:00 0.4 mg via INTRAVENOUS
  Filled 2019-07-10: qty 5

## 2019-07-10 NOTE — Progress Notes (Signed)
*  PRELIMINARY RESULTS* Echocardiogram 2D Echocardiogram has been performed.  Richard Conway 07/10/2019, 1:40 PM

## 2019-07-13 ENCOUNTER — Telehealth: Payer: Self-pay

## 2019-07-13 ENCOUNTER — Other Ambulatory Visit: Payer: Self-pay

## 2019-07-13 ENCOUNTER — Other Ambulatory Visit (HOSPITAL_COMMUNITY): Payer: Medicare Other

## 2019-07-13 DIAGNOSIS — R002 Palpitations: Secondary | ICD-10-CM | POA: Diagnosis not present

## 2019-07-13 MED ORDER — METOPROLOL TARTRATE 25 MG PO TABS: 25.0000 mg | ORAL_TABLET | Freq: Two times a day (BID) | ORAL | 3 refills | Status: AC

## 2019-07-13 MED ORDER — ASPIRIN EC 81 MG PO TBEC: 81.0000 mg | DELAYED_RELEASE_TABLET | Freq: Every day | ORAL | 3 refills | Status: DC

## 2019-07-13 MED ORDER — ATORVASTATIN CALCIUM 40 MG PO TABS: 40.0000 mg | ORAL_TABLET | Freq: Every day | ORAL | 3 refills | Status: AC

## 2019-07-13 NOTE — Telephone Encounter (Signed)
Pt called about the new medication he's starting having a lot of calcium- states he's worried about taking it because he gets kidney stones easily

## 2019-07-13 NOTE — Telephone Encounter (Signed)
Patient notified, will start meds

## 2019-07-13 NOTE — Telephone Encounter (Signed)
Pt concerned over lipitor.I assured him he could take for his cholesterol.

## 2019-07-13 NOTE — Telephone Encounter (Signed)
-----   Message from Herminio Commons, MD sent at 07/10/2019  1:15 PM EDT ----- Evidence for prior heart attack with very small area of blockage.  This will be managed medically.  Please start aspirin 81 mg, Lopressor 25 mg twice daily, and atorvastatin 40 mg.

## 2019-07-15 ENCOUNTER — Encounter: Payer: Self-pay | Admitting: Internal Medicine

## 2019-07-16 DIAGNOSIS — R002 Palpitations: Secondary | ICD-10-CM | POA: Diagnosis not present

## 2019-07-16 DIAGNOSIS — K219 Gastro-esophageal reflux disease without esophagitis: Secondary | ICD-10-CM | POA: Diagnosis not present

## 2019-07-16 DIAGNOSIS — R0602 Shortness of breath: Secondary | ICD-10-CM | POA: Diagnosis not present

## 2019-07-16 DIAGNOSIS — M545 Low back pain: Secondary | ICD-10-CM | POA: Diagnosis not present

## 2019-07-16 DIAGNOSIS — M79604 Pain in right leg: Secondary | ICD-10-CM | POA: Diagnosis not present

## 2019-07-20 ENCOUNTER — Telehealth: Payer: Self-pay

## 2019-07-20 DIAGNOSIS — R531 Weakness: Secondary | ICD-10-CM | POA: Diagnosis not present

## 2019-07-20 DIAGNOSIS — R269 Unspecified abnormalities of gait and mobility: Secondary | ICD-10-CM | POA: Diagnosis not present

## 2019-07-20 NOTE — Telephone Encounter (Signed)
I spoke with patient, he feels somewhat better after taking lopressor. He is also going to avoid caffeine.

## 2019-07-20 NOTE — Telephone Encounter (Signed)
-----   Message from Herminio Commons, MD sent at 07/17/2019  3:17 PM EDT ----- Several extra beats and runs of SVT.  I recently started Lopressor 25 mg twice daily.  I would like to see if this helps suppress tachycardia/symptoms.

## 2019-07-27 ENCOUNTER — Encounter (HOSPITAL_COMMUNITY)
Admission: RE | Admit: 2019-07-27 | Discharge: 2019-07-27 | Disposition: A | Discharge status: Home / Self Care | Payer: Medicare Other | Source: Ambulatory Visit | Attending: Internal Medicine | Admitting: Internal Medicine

## 2019-07-27 ENCOUNTER — Other Ambulatory Visit: Payer: Self-pay

## 2019-07-27 DIAGNOSIS — C7951 Secondary malignant neoplasm of bone: Secondary | ICD-10-CM | POA: Diagnosis not present

## 2019-07-27 DIAGNOSIS — R918 Other nonspecific abnormal finding of lung field: Secondary | ICD-10-CM | POA: Insufficient documentation

## 2019-07-27 DIAGNOSIS — R590 Localized enlarged lymph nodes: Secondary | ICD-10-CM | POA: Diagnosis not present

## 2019-07-27 MED ORDER — FLUDEOXYGLUCOSE F - 18 (FDG) INJECTION
13.5900 | Freq: Once | INTRAVENOUS | Status: AC | PRN
  Administered 2019-07-27: 17:00:00 13.59 via INTRAVENOUS

## 2019-07-28 ENCOUNTER — Other Ambulatory Visit: Payer: Self-pay

## 2019-07-28 ENCOUNTER — Encounter (HOSPITAL_COMMUNITY): Payer: Self-pay

## 2019-07-29 ENCOUNTER — Inpatient Hospital Stay (HOSPITAL_COMMUNITY): Payer: Medicare Other | Attending: Hematology | Admitting: Hematology

## 2019-07-29 ENCOUNTER — Encounter (HOSPITAL_COMMUNITY): Payer: Self-pay | Admitting: Hematology

## 2019-07-29 DIAGNOSIS — C7951 Secondary malignant neoplasm of bone: Secondary | ICD-10-CM

## 2019-07-29 DIAGNOSIS — C771 Secondary and unspecified malignant neoplasm of intrathoracic lymph nodes: Secondary | ICD-10-CM

## 2019-07-29 DIAGNOSIS — C778 Secondary and unspecified malignant neoplasm of lymph nodes of multiple regions: Secondary | ICD-10-CM | POA: Diagnosis not present

## 2019-07-29 DIAGNOSIS — C799 Secondary malignant neoplasm of unspecified site: Secondary | ICD-10-CM | POA: Insufficient documentation

## 2019-07-29 DIAGNOSIS — C801 Malignant (primary) neoplasm, unspecified: Secondary | ICD-10-CM | POA: Diagnosis not present

## 2019-07-29 NOTE — Assessment & Plan Note (Addendum)
1.  Widespread metastatic disease: -Presentation to ER with chest pain on 06/15/2019, CT chest PE protocol showed mediastinal, left supraclavicular, upper abdominal adenopathy with bilateral pulmonary nodules measuring up to 13 mm. -CT of the abdomen on 07/06/2019 showed 2.7 x 2.0 cm soft tissue mass within the posterior aspect of the gastric fundus, numerous soft tissue nodules within the mesentery of the pelvis, low-attenuation lesion within the anterior aspect right lobe of the liver, multiple pleural-based areas of low-attenuation along the posterior aspect of the right lung base, mild para-aortic and aortocaval lymphadenopathy. -Patient reportedly lost about 33 pounds in the last 3 months due to decreased appetite. -He never had a colonoscopy.  Denies any bleeding per rectum or melena.  Occasional nausea but no vomiting. -He has baseline constipation and denies any diarrhea. -PET CT scan on 07/27/2019 showed widespread hypermetabolic metastatic disease in the chest, abdomen and pelvis including mediastinal and potentially right hilar adenopathy, bilateral subpleural lung nodules, hepatic lesion, right adrenal uptake, abdominal lymphadenopathy, omental nodularity and bone metastasis. -CEA was mildly elevated at 9.5. -I have discussed the findings of the scans with the patient, his wife and his daughter in detail.  I have recommended biopsy for diagnosis and prognosis.  The scan findings are consistent with metastatic colon cancer.  At first he was reluctant to consider any further intervention.  But he finally agreed to have the biopsy done.  We will have it scheduled with radiology. -I will see him back after the biopsy to review the results and further plan.  2.  Family history: -Sister died of colon cancer at age 67. -Paternal grandfather had colon cancer.  3.  Palpitations: -He has been started on metoprolol 25 mg twice daily for tachycardia and palpitations.

## 2019-07-29 NOTE — Progress Notes (Signed)
[facility] NOTE  Patient Care Team: Celene Squibb, MD as PCP - General (Internal Medicine) Herminio Commons, MD as PCP - Cardiology (Cardiology) Gala Romney Cristopher Estimable, MD as Consulting Physician (Gastroenterology)  CHIEF COMPLAINTS/PURPOSE OF CONSULTATION:  Metastatic cancer.  HISTORY OF PRESENTING ILLNESS:  Richard Conway 74 y.o. male is seen in consultation today at the request of Dr. Nevada Crane for further work-up and management of metastatic cancer.  He initially presented to the ER with chest pain on 06/15/2019.  A chest CT scan showed mediastinal adenopathy and bilateral lung nodules.  He came to the ER again with abdominal pain.  A CT scan of the abdomen on 07/06/2019 showed soft tissue mass within the posterior aspect of the gastric fundus, numerous soft tissue nodules in the mesentery of the pelvis, low-attenuation lesion in the anterior aspect of the right lobe of the liver, multiple pleural-based areas of low-attenuation along the posterior aspect of the right lung base, mild para-aortic and aortocaval lymphadenopathy.  He reportedly never had a colonoscopy.  He denies any bleeding per rectum or melena.  However he reports constipation which is chronic.  Patient was set up for an outpatient PET scan by Dr. Nevada Crane.  This showed widespread hypermetabolic metastatic disease in the chest, abdomen and pelvis including mediastinal and potential right hilar adenopathy, bilateral subpleural lung nodules, hepatic lesion, right adrenal uptake, abdominal lymphadenopathy-year-old omental nodularity and bone mets.  He lost about 33 pounds in the last 3 months due to decreased appetite.  Reports occasional nausea but denied any vomiting.  Reports easy shortness of breath on exertion.  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  Prior to retirement, he worked in Charity fundraiser for 31 years.  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  He  smoked about 15 pack years and quit in 1985.  Family history significant for sister died of colon cancer at age 62.  Paternal grandfather also had colon cancer.  Appetite and energy levels are 50%.  He does not report any pains at this time.  MEDICAL HISTORY:  Past Medical History:  Diagnosis Date  . Arthritis   . Hypertension   . Renal disorder    kidney stones    SURGICAL HISTORY: Past Surgical History:  Procedure Laterality Date  . CHOLECYSTECTOMY    . KNEE SURGERY    . LITHOTRIPSY      SOCIAL HISTORY: Social History   Socioeconomic History  . Marital status: Married    Spouse name: Not on file  . Number of children: 3  . Years of education: Not on file  . Highest education level: Not on file  Occupational History  . Occupation: RETIRED  Tobacco Use  . Smoking status: Former Smoker    Quit date: 1985    Years since quitting: 36.3  . Smokeless tobacco: Never Used  Substance and Sexual Activity  . Alcohol use: No  . Drug use: No  . Sexual activity: Not Currently  Other Topics Concern  . Not on file  Social History Narrative  . Not on file   Social Determinants of Health   Financial Resource Strain: Low Risk   . Difficulty of Paying Living Expenses: Not very hard  Food Insecurity:   . Worried About Charity fundraiser in the Last Year:   . Arboriculturist in the Last Year:   Transportation Needs: No Transportation  Needs  . Lack of Transportation (Medical): No  . Lack of Transportation (Non-Medical): No  Physical Activity:   . Days of Exercise per Week:   . Minutes of Exercise per Session:   Stress: Stress Concern Present  . Feeling of Stress : To some extent  Social Connections:   . Frequency of Communication with Friends and Family:   . Frequency of Social Gatherings with Friends and Family:   . Attends Religious Services:   . Active Member of Clubs or Organizations:   . Attends Archivist Meetings:   Marland Kitchen Marital Status:   Intimate Partner  Violence: Not At Risk  . Fear of Current or Ex-Partner: No  . Emotionally Abused: No  . Physically Abused: No  . Sexually Abused: No    FAMILY HISTORY: Family History  Problem Relation Age of Onset  . Emphysema Mother   . Stroke Father   . Cirrhosis Sister   . Heart disease Maternal Grandfather   . Diabetes Paternal Grandmother   . Colon cancer Paternal Grandfather   . Emphysema Sister   . Colon cancer Sister   . Emphysema Brother   . Thyroid disease Daughter   . Glaucoma Daughter   . Heart disease Son   . Gout Son   . Hypertension Son     ALLERGIES:  is allergic to advil [ibuprofen] and nyquil multi-symptom [pseudoeph-doxylamine-dm-apap].  MEDICATIONS:  Current Outpatient Medications  Medication Sig Dispense Refill  . acetaminophen (TYLENOL) 325 MG tablet Take 650 mg by mouth every 4 (four) hours as needed for mild pain.    Marland Kitchen aspirin EC 81 MG tablet Take 1 tablet (81 mg total) by mouth daily. 90 tablet 3  . atorvastatin (LIPITOR) 40 MG tablet Take 1 tablet (40 mg total) by mouth daily. 90 tablet 3  . benazepril (LOTENSIN) 10 MG tablet Take 10 mg by mouth daily.    Marland Kitchen gabapentin (NEURONTIN) 300 MG capsule Take 300 mg by mouth 3 (three) times daily.    . Glucosamine HCl (GLUCOSAMINE PO) Take 1 tablet by mouth daily.    Marland Kitchen HYDROcodone-acetaminophen (NORCO/VICODIN) 5-325 MG tablet Take 1 tablet by mouth 2 (two) times daily as needed.    Marland Kitchen HYDROcodone-acetaminophen (NORCO/VICODIN) 5-325 MG tablet Take 1 tablet by mouth every 6 (six) hours as needed. (Patient not taking: Reported on 07/28/2019) 6 tablet 0  . lisinopril-hydrochlorothiazide (ZESTORETIC) 10-12.5 MG tablet Take 1 tablet by mouth daily.    Marland Kitchen LORazepam (ATIVAN) 0.5 MG tablet Take 0.5 mg by mouth at bedtime.    . meclizine (ANTIVERT) 25 MG tablet Take 1 tablet (25 mg total) by mouth every 6 (six) hours as needed for dizziness. (Patient not taking: Reported on 07/28/2019) 20 tablet 0  . meloxicam (MOBIC) 15 MG tablet Take 15  mg by mouth daily.     . metoprolol tartrate (LOPRESSOR) 25 MG tablet Take 1 tablet (25 mg total) by mouth 2 (two) times daily. 180 tablet 3  . Multiple Vitamin (MULTIVITAMIN WITH MINERALS) TABS tablet Take 1 tablet by mouth daily.    . Omega-3 Fatty Acids (FISH OIL) 500 MG CAPS Take 1 capsule by mouth 2 (two) times daily.    Marland Kitchen omeprazole (PRILOSEC) 40 MG capsule Take 40 mg by mouth daily.    . ondansetron (ZOFRAN) 8 MG tablet Take 8 mg by mouth 3 (three) times daily.    . pantoprazole (PROTONIX) 40 MG tablet Take 40 mg by mouth daily.    . promethazine (PHENERGAN) 25 MG tablet  Take 25 mg by mouth every 6 (six) hours as needed.    . sucralfate (CARAFATE) 1 g tablet Take 1 g by mouth 4 (four) times daily.    . traMADol (ULTRAM) 50 MG tablet Take 50 mg by mouth every 6 (six) hours as needed.     No current facility-administered medications for this visit.    REVIEW OF SYSTEMS:   Constitutional: Denies fevers, chills or abnormal night sweats Eyes: Denies blurriness of vision, double vision or watery eyes Ears, nose, mouth, throat, and face: Denies mucositis or sore throat Respiratory: Positive for dyspnea on exertion. Cardiovascular: Denies palpitation, chest discomfort or lower extremity swelling Gastrointestinal: Positive for constipation.  Denies any bleeding per rectum or melena. Skin: Denies abnormal skin rashes Lymphatics: Denies new lymphadenopathy or easy bruising Neurological:Denies numbness, tingling or new weaknesses Behavioral/Psych: Mood is stable, no new changes  All other systems were reviewed with the patient and are negative.  PHYSICAL EXAMINATION: ECOG PERFORMANCE STATUS: 1 - Symptomatic but completely ambulatory  Vitals:   07/29/19 1309  BP: (!) 96/48  Pulse: 63  Resp: 19  Temp: (!) 96.8 F (36 C)  SpO2: 98%   Filed Weights   07/29/19 1309  Weight: 202 lb 8 oz (91.9 kg)    GENERAL:alert, no distress and comfortable SKIN: skin color, texture, turgor are  normal, no rashes or significant lesions EYES: normal, conjunctiva are pink and non-injected, sclera clear OROPHARYNX:no exudate, no erythema and lips, buccal mucosa, and tongue normal  NECK: supple, thyroid normal size, non-tender, without nodularity LYMPH:  no palpable lymphadenopathy in the cervical, axillary or inguinal LUNGS: clear to auscultation and percussion with normal breathing effort HEART: regular rate & rhythm and no murmurs and no lower extremity edema ABDOMEN:abdomen soft, non-tender and normal bowel sounds Musculoskeletal:no cyanosis of digits and no clubbing  PSYCH: alert & oriented x 3 with fluent speech NEURO: no focal motor/sensory deficits  LABORATORY DATA:  I have reviewed the data as listed Lab Results  Component Value Date   WBC 12.8 (H) 07/06/2019   HGB 11.9 (L) 07/06/2019   HCT 38.1 (L) 07/06/2019   MCV 90.9 07/06/2019   PLT 554 (H) 07/06/2019     Chemistry      Component Value Date/Time   NA 132 (L) 07/06/2019 1823   K 4.1 07/06/2019 1823   CL 95 (L) 07/06/2019 1823   CO2 25 07/06/2019 1823   BUN 18 07/06/2019 1823   CREATININE 1.09 07/06/2019 1823      Component Value Date/Time   CALCIUM 8.9 07/06/2019 1823   ALKPHOS 69 07/06/2019 1823   AST 30 07/06/2019 1823   ALT 22 07/06/2019 1823   BILITOT 0.3 07/06/2019 1823       RADIOGRAPHIC STUDIES: I have personally reviewed the radiological images as listed and agreed with the findings in the report. CT ABDOMEN PELVIS W CONTRAST  Result Date: 07/06/2019 CLINICAL DATA:  Lower abdominal pain. EXAM: CT ABDOMEN AND PELVIS WITH CONTRAST TECHNIQUE: Multidetector CT imaging of the abdomen and pelvis was performed using the standard protocol following bolus administration of intravenous contrast. CONTRAST:  162mL OMNIPAQUE IOHEXOL 300 MG/ML  SOLN COMPARISON:  April 23, 2005 FINDINGS: Lower chest: Multiple 2 mm and 3 mm noncalcified lung nodules are seen within the posterior aspect of the left lung base. 4  mm and 8 mm noncalcified lung nodules are seen within the right middle lobe. These are not identified on the prior study. An additional 6 mm noncalcified anterolateral right middle  lobe lung nodule is seen (axial CT image 21, CT series number 4). Multiple pleural based areas of low attenuation are seen along the posterior aspect of the right lung base. The largest measures approximately 1.8 cm in length (axial CT image 55, CT series number 4). A similar appearing 1.3 cm pleural based area of low attenuation is seen within the posterolateral aspect of the left lung base (axial CT image 33, CT series number 4). Hepatobiliary: A 2.1 cm x 1.6 cm lobulated focus of low attenuation is seen within the anterior aspect of the right lobe of the liver (axial CT image 14, CT series number 2). An additional 0.4 cm diameter focus of low attenuation is seen in this region. Status post cholecystectomy. No biliary dilatation. Pancreas: Unremarkable. No pancreatic ductal dilatation. Multiple subcentimeter mesenteric lymph nodes are seen anterior and inferior to the body of the pancreas. A mild amount of surrounding nonspecific mesenteric inflammatory fat stranding is seen. Spleen: Normal in size without focal abnormality. Adrenals/Urinary Tract: A 1.9 cm x 1.6 cm isodense right adrenal mass is seen. An additional 2.4 cm x 1.7 cm low-attenuation right adrenal mass is noted. These are seen on the prior study and mildly increased in size on the current exam. Kidneys are normal, without renal calculi, focal lesion, or hydronephrosis. Bladder is unremarkable. Stomach/Bowel: A 2.7 cm x 2.0 cm low-attenuation soft tissue mass is seen within the posterior aspect of the gastric fundus (axial CT image 26, CT series number 2). A portion of this mass contains fat (approximately -29.67 Hounsfield units) and is stable in appearance when compared to the prior study. Appendix appears normal. No evidence of bowel wall thickening, distention, or  inflammatory changes. Noninflamed diverticula are seen within the proximal sigmoid colon. Multiple tiny soft tissue nodules are seen within the mesentery of the anterior and lateral aspects of the mid to upper abdomen on the left. An additional 2.4 cm x 1.6 cm area of soft tissue attenuation is seen within the lateral aspect of the left lower quadrant, posterior to the junction of the descending colon and sigmoid colon. Numerous tiny mesenteric soft tissue nodules are also seen within the pelvis. Vascular/Lymphatic: Moderate severity aortic atherosclerosis. No enlarged abdominal or pelvic lymph nodes. Reproductive: The prostate gland is mildly enlarged. Mild to moderate severity prostate gland calcification is also seen. Other: There is a mild amount of para-aortic and aortocaval lymphadenopathy. A trace amount of posterior pelvic free fluid is seen. Musculoskeletal: Multilevel degenerative changes seen throughout the lumbar spine. IMPRESSION: 1. Stable 2.7 cm x 2.0 cm low-attenuation, partially fat attenuation soft tissue mass within the posterior aspect of the gastric fundus. 2. Numerous tiny soft tissue nodules within the mesentery of the pelvis, mid to upper abdomen on the left and posterior to the junction of the descending colon and sigmoid colon. These findings are concerning for metastatic disease. 3. Foci of low attenuation within the anterior aspect of the right lobe of the liver. While these may represent small hemangiomas, an underlying neoplastic process cannot be excluded. 4. Multiple pleural based areas of low attenuation along the posterior aspect of the right lung base, as well as multiple noncalcified lung nodules in the right middle lobe and left lung base. These findings are concerning for metastatic disease. 5. Mild para-aortic and aortocaval lymphadenopathy. 6. Noninflamed sigmoid diverticulosis. 7. Mild to moderate severity prostate gland calcification. Recommend correlation with PSA. 8.  Moderate severity aortic atherosclerosis. Aortic Atherosclerosis (ICD10-I70.0). Electronically Signed   By: Virgina Norfolk  M.D.   On: 07/06/2019 20:40   Cardiac event monitor  Result Date: 07/17/2019  Sinus rhythm with PACs and PVCs. There were several paroxysms of SVT and possibly SVT with aberrancy.    NM Myocar Multi W/Spect W/Wall Motion / EF  Result Date: 07/10/2019  There was no ST segment deviation noted during stress.  No T wave inversion was noted during stress.  Findings consistent with moderate size prior inferior/inferoseptal myocardial infarction with mild peri-infarct ischemia.  This is a low risk study.  The left ventricular ejection fraction is normal (55-65%).    NM PET Image Initial (PI) Skull Base To Thigh  Result Date: 07/28/2019 CLINICAL DATA:  Initial treatment strategy for thoracic lymphadenopathy. EXAM: NUCLEAR MEDICINE PET SKULL BASE TO THIGH TECHNIQUE: 13.6 mCi F-18 FDG was injected intravenously. Full-ring PET imaging was performed from the skull base to thigh after the radiotracer. CT data was obtained and used for attenuation correction and anatomic localization. Fasting blood glucose: 100 mg/dl COMPARISON:  CTA chest 06/15/2019.  Abdomen and pelvis CT 07/06/2019 FINDINGS: Mediastinal blood pool activity: SUV max 3.1 Liver activity: SUV max NA NECK: No hypermetabolic lymph nodes in the neck. Incidental CT findings: none CHEST: Hypermetabolic mediastinal lymphadenopathy evident. 2.1 cm short axis right paratracheal node on 113/3 demonstrates SUV max = 3.3. 1.7 cm short axis anterior mediastinal node on the same image demonstrates SUV max = 3.7. No hypermetabolic lymphadenopathy seen in either axillary region. Focal hypermetabolism in the right hilum demonstrates SUV max = 4.3. Hypermetabolic subpleural nodules are seen in both lungs. Index 9 mm pleural-based nodule in the right upper lobe (121/3) demonstrates SUV max = 3.8. 1.5 cm paraspinal right lower lobe subpleural  nodule demonstrates SUV max = 5.2. 2 cm posterior left lower lobe subpleural nodule (164/3) demonstrates SUV max = 6.1. Scattered tiny pulmonary nodules are below the size threshold for reliable resolution on PET imaging. Incidental CT findings: Coronary artery calcification is evident. Atherosclerotic calcification is noted in the wall of the thoracic aorta. ABDOMEN/PELVIS: 2.2 cm lesion anterior hepatic dome ([id number]) is hypermetabolic with SUV max = 7.1. Background mottled liver uptake evident, but likely additional tiny hypermetabolic liver lesions. 2 cm right adrenal nodule is hypermetabolic with SUV max = 6.2. There is hypermetabolic lymphadenopathy in the upper abdomen with 14 mm short axis pre caval node (194/3) demonstrating SUV max = 5.6 Omental nodularity, nearly confluent in some regions (see image [id number]) is hypermetabolic. Marked hypermetabolism is seen in the right colon with SUV max = 11.5. This corresponds to a region of irregular wall thickening in the ascending colon, partially obscured by motion (image 212/series 3). There is an area of wall thickening showing low attenuation/fat attenuation in the gastric fundus, as described on previous CT scan. This region of the stomach shows no substantial hypermetabolic FDG uptake. Incidental CT findings: There is abdominal aortic atherosclerosis without aneurysm. Small volume intraperitoneal free fluid. SKELETON: Numerous hypermetabolic bone metastases are evident involve an spine, ribs, scapula day, and bony pelvis. Index lesion in the right sacrum demonstrates SUV max = 6.4. Incidental CT findings: Degenerative changes noted lumbar spine. IMPRESSION: 1. Widespread hypermetabolic metastatic disease in the chest, abdomen, and pelvis. This includes mediastinal and potentially right hilar lymphadenopathy, bilateral subpleural lung nodules, hepatic lesion(s), adrenal disease, abdominal lymphadenopathy omental nodularity, and bone metastases. Numerous tiny  pulmonary nodules are too small to characterize by PET imaging. There is focal hypermetabolism in the ascending colon and although not well demonstrated on today's study or the previous  diagnostic abdomen/pelvis CT, a suggestion of irregular wall thickening in this region raises the question of primary neoplasm of the right colon. No other definite primary source identified on this study. 2.  Aortic Atherosclerois (ICD10-170.0) Electronically Signed   By: Misty Stanley M.D.   On: 07/28/2019 09:36   ECHOCARDIOGRAM COMPLETE  Result Date: 07/10/2019    ECHOCARDIOGRAM REPORT   Patient Name:   Richard Conway Date of Exam: 07/10/2019 Medical Rec #:  [id number]       Height:       73.0 in Accession #:    [id number]      Weight:       223.0 lb Date of Birth:  [birth year]       BSA:          2.254 m Patient Age:    34 years        BP:           126/87 mmHg Patient Gender: M               HR:           88 bpm. Exam Location:  [facility] Procedure: 2D Echo, Cardiac Doppler and Color Doppler Indications:    R00.2 (ICD-10-CM) - Palpitations                 R06.00 (ICD-10-CM) - DOE (dyspnea on exertion)  History:        Patient has no prior history of Echocardiogram examinations.                 Risk Factors:Hypertension. Renal disorder (From Hx).  Sonographer:    BW Referring Phys: [id number] [place]  Sonographer Comments: Subcostal windows are difficult to image IMPRESSIONS  1. Left ventricular ejection fraction, by estimation, is 60 to 65%. The left ventricle has normal function. The left ventricle has no regional wall motion abnormalities. There is mild left ventricular hypertrophy. Left ventricular diastolic parameters are consistent with Grade I diastolic dysfunction (impaired relaxation).  2. Right ventricular systolic function is normal. The right ventricular size is normal. There is moderately elevated pulmonary artery systolic pressure.  3. The mitral valve is normal in structure. Trivial mitral valve  regurgitation. No evidence of mitral stenosis.  4. The aortic valve is tricuspid. Aortic valve regurgitation is not visualized. No aortic stenosis is present. FINDINGS  Left Ventricle: Left ventricular ejection fraction, by estimation, is 60 to 65%. The left ventricle has normal function. The left ventricle has no regional wall motion abnormalities. The left ventricular internal cavity size was normal in size. There is  mild left ventricular hypertrophy. Left ventricular diastolic parameters are consistent with Grade I diastolic dysfunction (impaired relaxation). Normal left ventricular filling pressure. Right Ventricle: The right ventricular size is normal. No increase in right ventricular wall thickness. Right ventricular systolic function is normal. There is moderately elevated pulmonary artery systolic pressure. The tricuspid regurgitant velocity is 2.80 m/s, and with an assumed right atrial pressure of 10 mmHg, the estimated right ventricular systolic pressure is 123456 mmHg. Left Atrium: Left atrial size was normal in size. Right Atrium: Right atrial size was normal in size. Pericardium: There is no evidence of pericardial effusion. Mitral Valve: The mitral valve is normal in structure. Trivial mitral valve regurgitation. No evidence of mitral valve stenosis. Tricuspid Valve: The tricuspid valve is normal in structure. Tricuspid valve regurgitation is mild . No evidence of tricuspid stenosis. Aortic Valve: The aortic valve is tricuspid. Aortic valve regurgitation  is not visualized. No aortic stenosis is present. Aortic valve mean gradient measures 4.3 mmHg. Aortic valve peak gradient measures 10.9 mmHg. Aortic valve area, by VTI measures 3.24  cm. Pulmonic Valve: The pulmonic valve was not well visualized. Pulmonic valve regurgitation is not visualized. No evidence of pulmonic stenosis. Aorta: The aortic root is normal in size and structure. Pulmonary Artery: Indeterminant PASP, inadequate TR jet. Venous: The  inferior vena cava was not well visualized. IAS/Shunts: The interatrial septum was not well visualized.  LEFT VENTRICLE PLAX 2D LVIDd:         4.19 cm  Diastology LVIDs:         2.86 cm  LV e' lateral:   11.00 cm/s LV PW:         1.13 cm  LV E/e' lateral: 5.4 LV IVS:        1.10 cm  LV e' medial:    6.31 cm/s LVOT diam:     2.00 cm  LV E/e' medial:  9.4 LV SV:         81 LV SV Index:   36 LVOT Area:     3.14 cm  RIGHT VENTRICLE RV Mid diam:    3.33 cm RV S prime:     14.40 cm/s TAPSE (M-mode): 2.2 cm LEFT ATRIUM             Index LA diam:        3.30 cm 1.46 cm/m LA Vol (A2C):   48.5 ml 21.52 ml/m LA Vol (A4C):   36.4 ml 16.15 ml/m LA Biplane Vol: 44.9 ml 19.92 ml/m  AORTIC VALVE AV Area (Vmax):    2.65 cm AV Area (Vmean):   3.01 cm AV Area (VTI):     3.24 cm AV Vmax:           164.71 cm/s AV Vmean:          92.755 cm/s AV VTI:            0.251 m AV Peak Grad:      10.9 mmHg AV Mean Grad:      4.3 mmHg LVOT Vmax:         139.00 cm/s LVOT Vmean:        89.000 cm/s LVOT VTI:          0.259 m LVOT/AV VTI ratio: 1.03  AORTA Ao Root diam: 3.80 cm MITRAL VALVE               TRICUSPID VALVE MV Area (PHT): 2.69 cm    TR Peak grad:   31.4 mmHg MV Decel Time: 282 msec    TR Vmax:        280.00 cm/s MV E velocity: 59.50 cm/s MV A velocity: 75.20 cm/s  SHUNTS MV E/A ratio:  0.79        Systemic VTI:  0.26 m                            Systemic Diam: 2.00 cm Carlyle Dolly MD Electronically signed by Carlyle Dolly MD Signature Date/Time: 07/10/2019/4:49:50 PM    Final     ASSESSMENT & PLAN:  Metastatic cancer ([place]) 1.  Widespread metastatic disease: -Presentation to ER with chest pain on 06/15/2019, CT chest PE protocol showed mediastinal, left supraclavicular, upper abdominal adenopathy with bilateral pulmonary nodules measuring up to 13 mm. -CT of the abdomen on 07/06/2019 showed 2.7 x 2.0 cm soft tissue mass within  the posterior aspect of the gastric fundus, numerous soft tissue nodules within the mesentery of  the pelvis, low-attenuation lesion within the anterior aspect right lobe of the liver, multiple pleural-based areas of low-attenuation along the posterior aspect of the right lung base, mild para-aortic and aortocaval lymphadenopathy. -Patient reportedly lost about 33 pounds in the last 3 months due to decreased appetite. -He never had a colonoscopy.  Denies any bleeding per rectum or melena.  Occasional nausea but no vomiting. -He has baseline constipation and denies any diarrhea. -PET CT scan on 07/27/2019 showed widespread hypermetabolic metastatic disease in the chest, abdomen and pelvis including mediastinal and potentially right hilar adenopathy, bilateral subpleural lung nodules, hepatic lesion, right adrenal uptake, abdominal lymphadenopathy, omental nodularity and bone metastasis. -CEA was mildly elevated at 9.5. -I have discussed the findings of the scans with the patient, his wife and his daughter in detail.  I have recommended biopsy for diagnosis and prognosis.  The scan findings are consistent with metastatic colon cancer.  At first he was reluctant to consider any further intervention.  But he finally agreed to have the biopsy done.  We will have it scheduled with radiology. -I will see him back after the biopsy to review the results and further plan.  2.  Family history: -Sister died of colon cancer at age 33. -Paternal grandfather had colon cancer.  3.  Palpitations: -He has been started on metoprolol 25 mg twice daily for tachycardia and palpitations.  Orders Placed This Encounter  Procedures  . CT Biopsy    Standing Status:   Future    Standing Expiration Date:   07/28/2020    Order Specific Question:   Lab orders requested (DO NOT place separate lab orders, these will be automatically ordered during procedure specimen collection):    Answer:   Surgical Pathology    Order Specific Question:   Reason for Exam (SYMPTOM  OR DIAGNOSIS REQUIRED)    Answer:   liver mass    Order  Specific Question:   Preferred location?    Answer:   [facility]    Order Specific Question:   Radiology Contrast Protocol - do NOT remove file path    Answer:   \\[email or web address]    All questions were answered. The patient knows to call the clinic with any problems, questions or concerns.      Derek Jack, MD 07/29/2019 2:08 PM

## 2019-07-29 NOTE — Patient Instructions (Signed)
Richard Conway at [facility] Discharge Instructions  You were seen today by Dr. Delton Coombes. He went over your history, family history and how you've been feeling since your recent ER visit. He recommends that you have a liver biopsy. He will see you back after your biopsy for follow up.   Thank you for choosing [facility] to provide your oncology and hematology care.  To afford each patient quality time with our provider, please arrive at least 15 minutes before your scheduled appointment time.   If you have a lab appointment with the [facility] please come in thru the  Main Entrance and check in at the main information desk  You need to re-schedule your appointment should you arrive 10 or more minutes late.  We strive to give you quality time with our providers, and arriving late affects you and other patients whose appointments are after yours.  Also, if you no show three or more times for appointments you may be dismissed from the clinic at the providers discretion.     Again, thank you for choosing [facility].  Our hope is that these requests will decrease the amount of time that you wait before being seen by our physicians.       _____________________________________________________________  Should you have questions after your visit to [facility], please contact our office at [phone] between the hours of 8:00 a.m. and 4:30 p.m.  Voicemails left after 4:00 p.m. will not be returned until the following business day.  For prescription refill requests, have your pharmacy contact our office and allow 72 hours.    Cancer Center Support Programs:   > Cancer Support Group  2nd Tuesday of the month 1pm-2pm, Journey Room

## 2019-07-30 ENCOUNTER — Encounter (HOSPITAL_COMMUNITY): Payer: Self-pay

## 2019-07-30 NOTE — Progress Notes (Signed)
Richard Conway Male, 74 y.o., 1945/12/18 MRN:  [id number] Phone:  [phone] ([phone] PCP:  Celene Squibb, MD Coverage:  [place] With Radiology ([facility]-US 2) 08/04/2019 at 1:00 PM  RE: Biopsy Received: Yesterday Message Contents  Corrie Mckusick, DO  [name], [name] for US guided liver mass biopsy attempt.   Richard Conway   Previous Messages  ----- Message -----  From: Lenore Cordia  Sent: 07/29/2019  2:36 PM EDT  To: [facility] Procedure Requests  Subject: Biopsy                      Procedure Requested: CT Biopsy    Reason for Procedure: liver mass    Provider Requesting: Dr Delton Coombes  Provider Telephone: [phone]    Other Info: Rad exam in Epic

## 2019-08-03 ENCOUNTER — Other Ambulatory Visit: Payer: Self-pay | Admitting: Student

## 2019-08-04 ENCOUNTER — Other Ambulatory Visit: Payer: Self-pay

## 2019-08-04 ENCOUNTER — Ambulatory Visit (HOSPITAL_COMMUNITY)
Admission: RE | Admit: 2019-08-04 | Discharge: 2019-08-04 | Disposition: A | Discharge status: Home / Self Care | Payer: Medicare Other | Source: Ambulatory Visit | Attending: Hematology | Admitting: Hematology

## 2019-08-04 ENCOUNTER — Other Ambulatory Visit (HOSPITAL_COMMUNITY): Payer: Self-pay | Admitting: Hematology

## 2019-08-04 ENCOUNTER — Encounter (HOSPITAL_COMMUNITY): Payer: Self-pay

## 2019-08-04 DIAGNOSIS — Z6830 Body mass index (BMI) 30.0-30.9, adult: Secondary | ICD-10-CM | POA: Diagnosis not present

## 2019-08-04 DIAGNOSIS — Z87891 Personal history of nicotine dependence: Secondary | ICD-10-CM | POA: Diagnosis not present

## 2019-08-04 DIAGNOSIS — Z87442 Personal history of urinary calculi: Secondary | ICD-10-CM | POA: Diagnosis not present

## 2019-08-04 DIAGNOSIS — I10 Essential (primary) hypertension: Secondary | ICD-10-CM | POA: Diagnosis not present

## 2019-08-04 DIAGNOSIS — R109 Unspecified abdominal pain: Secondary | ICD-10-CM | POA: Diagnosis not present

## 2019-08-04 DIAGNOSIS — Z886 Allergy status to analgesic agent status: Secondary | ICD-10-CM | POA: Diagnosis not present

## 2019-08-04 DIAGNOSIS — C771 Secondary and unspecified malignant neoplasm of intrathoracic lymph nodes: Secondary | ICD-10-CM

## 2019-08-04 DIAGNOSIS — Z79899 Other long term (current) drug therapy: Secondary | ICD-10-CM | POA: Diagnosis not present

## 2019-08-04 DIAGNOSIS — K769 Liver disease, unspecified: Secondary | ICD-10-CM | POA: Insufficient documentation

## 2019-08-04 DIAGNOSIS — R110 Nausea: Secondary | ICD-10-CM | POA: Diagnosis not present

## 2019-08-04 DIAGNOSIS — Z825 Family history of asthma and other chronic lower respiratory diseases: Secondary | ICD-10-CM | POA: Diagnosis not present

## 2019-08-04 DIAGNOSIS — C801 Malignant (primary) neoplasm, unspecified: Secondary | ICD-10-CM | POA: Diagnosis not present

## 2019-08-04 DIAGNOSIS — Z8379 Family history of other diseases of the digestive system: Secondary | ICD-10-CM | POA: Insufficient documentation

## 2019-08-04 DIAGNOSIS — R634 Abnormal weight loss: Secondary | ICD-10-CM | POA: Diagnosis not present

## 2019-08-04 DIAGNOSIS — Z823 Family history of stroke: Secondary | ICD-10-CM | POA: Insufficient documentation

## 2019-08-04 DIAGNOSIS — M1990 Unspecified osteoarthritis, unspecified site: Secondary | ICD-10-CM | POA: Diagnosis not present

## 2019-08-04 DIAGNOSIS — K7689 Other specified diseases of liver: Secondary | ICD-10-CM | POA: Diagnosis not present

## 2019-08-04 DIAGNOSIS — Z7982 Long term (current) use of aspirin: Secondary | ICD-10-CM | POA: Insufficient documentation

## 2019-08-04 DIAGNOSIS — Z800 Family history of malignant neoplasm of digestive organs: Secondary | ICD-10-CM | POA: Diagnosis not present

## 2019-08-04 DIAGNOSIS — Z8349 Family history of other endocrine, nutritional and metabolic diseases: Secondary | ICD-10-CM | POA: Diagnosis not present

## 2019-08-04 LAB — PROTIME-INR
INR: 1.2 (ref 0.8–1.2)
Prothrombin Time: 14.6 seconds (ref 11.4–15.2)

## 2019-08-04 MED ORDER — GELATIN ABSORBABLE 12-7 MM EX MISC
CUTANEOUS | Status: AC
  Filled 2019-08-04: qty 1

## 2019-08-04 MED ORDER — SODIUM CHLORIDE 0.9 % IV SOLN: INTRAVENOUS | Status: DC

## 2019-08-04 MED ORDER — MIDAZOLAM HCL 2 MG/2ML IJ SOLN
INTRAMUSCULAR | Status: AC
  Filled 2019-08-04: qty 2

## 2019-08-04 MED ORDER — FENTANYL CITRATE (PF) 100 MCG/2ML IJ SOLN
INTRAMUSCULAR | Status: AC
  Filled 2019-08-04: qty 2

## 2019-08-04 MED ORDER — LIDOCAINE HCL (PF) 1 % IJ SOLN
INTRAMUSCULAR | Status: AC
  Filled 2019-08-04: qty 30

## 2019-08-04 NOTE — Progress Notes (Signed)
Patient ID: Richard Conway, male   DOB: [birth year], 74 y.o.   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  Spoke with Dr Delton Coombes, he will see pt back in office to discuss options of bronch EBUS or colonscopy / bx  D/w pt. And discharged.

## 2019-08-04 NOTE — H&P (Signed)
Chief Complaint: Patient was seen in consultation today for liver lesion biopsy at the request of [place]  Referring Physician(s): Katragadda,Sreedhar  Supervising Physician: Daryll Brod  Patient Status: [facility] - Out-pt  History of Present Illness: Richard Conway is a 74 y.o. male   Pt was seen in ED 06/15/19 and 07/10/19 for chest pain and heart racing Symptoms included wt loss and abd pain Was released and referred with Cardiology appointment Cardio notes comment on PVCs and are following  Does endorse N/V and wt loss  Work up did include imaging CT: IMPRESSION: 1. Stable 2.7 cm x 2.0 cm low-attenuation, partially fat attenuation soft tissue mass within the posterior aspect of the gastric fundus. 2. Numerous tiny soft tissue nodules within the mesentery of the pelvis, mid to upper abdomen on the left and posterior to the junction of the descending colon and sigmoid colon. These findings are concerning for metastatic disease. 3. Foci of low attenuation within the anterior aspect of the right lobe of the liver. While these may represent small hemangiomas, an underlying neoplastic process cannot be excluded. 4. Multiple pleural based areas of low attenuation along the posterior aspect of the right lung base, as well as multiple noncalcified lung nodules in the right middle lobe and left lung base. These findings are concerning for metastatic disease. 5. Mild para-aortic and aortocaval lymphadenopathy.  4/26 PET:  IMPRESSION: 1. Widespread hypermetabolic metastatic disease in the chest, abdomen, and pelvis. This includes mediastinal and potentially right hilar lymphadenopathy, bilateral subpleural lung nodules, hepatic lesion(s), adrenal disease, abdominal lymphadenopathy omental nodularity, and bone metastases. Numerous tiny pulmonary nodules are too small to characterize by PET imaging. There is focal hypermetabolism in the ascending colon and although not  well demonstrated on today's study or the previous diagnostic abdomen/pelvis CT, a suggestion of irregular wall thickening in this region raises the question of primary neoplasm of the right colon. No other definite primary source identified on this study  ++Family Hx Colon Cancer  Scheduled now for liver lesion biopsy   Past Medical History:  Diagnosis Date  . Arthritis   . Hypertension   . Renal disorder    kidney stones    Past Surgical History:  Procedure Laterality Date  . CHOLECYSTECTOMY    . KNEE SURGERY    . LITHOTRIPSY      Allergies: Advil [ibuprofen] and Nyquil multi-symptom [pseudoeph-doxylamine-dm-apap]  Medications: Prior to Admission medications   Medication Sig Start Date End Date Taking? Authorizing Provider  acetaminophen (TYLENOL) 325 MG tablet Take 650 mg by mouth every 4 (four) hours as needed for mild pain.   Yes [provider]  aspirin EC 81 MG tablet Take 1 tablet (81 mg total) by mouth daily. 07/13/19  Yes Herminio Commons, MD  atorvastatin (LIPITOR) 40 MG tablet Take 1 tablet (40 mg total) by mouth daily. 07/13/19 10/11/19 Yes Herminio Commons, MD  benazepril (LOTENSIN) 10 MG tablet Take 10 mg by mouth daily.   Yes [provider]  gabapentin (NEURONTIN) 300 MG capsule Take 300 mg by mouth 3 (three) times daily. 04/27/19  Yes [provider]  Glucosamine HCl (GLUCOSAMINE PO) Take 1 tablet by mouth daily.   Yes [provider]  HYDROcodone-acetaminophen (NORCO/VICODIN) 5-325 MG tablet Take 1 tablet by mouth 2 (two) times daily as needed for moderate pain.  05/21/19  Yes [provider]  lisinopril-hydrochlorothiazide (ZESTORETIC) 10-12.5 MG tablet Take 1 tablet by mouth daily. 05/04/19  Yes [provider]  LORazepam (ATIVAN)  0.5 MG tablet Take 0.5 mg by mouth at bedtime.   Yes [provider]  meloxicam (MOBIC) 15 MG tablet Take 15 mg by mouth daily.    Yes [provider]    metoprolol tartrate (LOPRESSOR) 25 MG tablet Take 1 tablet (25 mg total) by mouth 2 (two) times daily. 07/13/19 10/11/19 Yes Herminio Commons, MD  Multiple Vitamin (MULTIVITAMIN WITH MINERALS) TABS tablet Take 1 tablet by mouth daily.   Yes [provider]  Omega-3 Fatty Acids (FISH OIL) 500 MG CAPS Take 1 capsule by mouth 2 (two) times daily.   Yes [provider]  omeprazole (PRILOSEC) 40 MG capsule Take 40 mg by mouth daily. 05/11/19  Yes [provider]  ondansetron (ZOFRAN) 8 MG tablet Take 8 mg by mouth every 8 (eight) hours as needed for nausea or vomiting.  07/16/19  Yes [provider]  pantoprazole (PROTONIX) 40 MG tablet Take 40 mg by mouth daily. 06/08/19  Yes [provider]  promethazine (PHENERGAN) 25 MG tablet Take 25 mg by mouth every 6 (six) hours as needed for nausea or vomiting.  07/09/19  Yes [provider]  sucralfate (CARAFATE) 1 g tablet Take 1 g by mouth 4 (four) times daily -  with meals and at bedtime.  07/02/19  Yes [provider]  traMADol (ULTRAM) 50 MG tablet Take 50 mg by mouth every 6 (six) hours as needed for moderate pain.  06/08/19  Yes [provider]  HYDROcodone-acetaminophen (NORCO/VICODIN) 5-325 MG tablet Take 1 tablet by mouth every 6 (six) hours as needed. Patient not taking: Reported on 07/28/2019 06/15/19   Suzy Bouchard, PA-C  meclizine (ANTIVERT) 25 MG tablet Take 1 tablet (25 mg total) by mouth every 6 (six) hours as needed for dizziness. Patient not taking: Reported on 07/28/2019 11/01/12   Riki Altes, MD     Family History  Problem Relation Age of Onset  . Emphysema Mother   . Stroke Father   . Cirrhosis Sister   . Heart disease Maternal Grandfather   . Diabetes Paternal Grandmother   . Colon cancer Paternal Grandfather   . Emphysema Sister   . Colon cancer Sister   . Emphysema Brother   . Thyroid disease Daughter   . Glaucoma Daughter   . Heart disease Son   . Gout Son    . Hypertension Son     Social History   Socioeconomic History  . Marital status: Married    Spouse name: Not on file  . Number of children: 3  . Years of education: Not on file  . Highest education level: Not on file  Occupational History  . Occupation: RETIRED  Tobacco Use  . Smoking status: Former Smoker    Quit date: 1985    Years since quitting: 36.3  . Smokeless tobacco: Never Used  Substance and Sexual Activity  . Alcohol use: No  . Drug use: No  . Sexual activity: Not Currently  Other Topics Concern  . Not on file  Social History Narrative  . Not on file   Social Determinants of Health   Financial Resource Strain: Low Risk   . Difficulty of Paying Living Expenses: Not very hard  Food Insecurity:   . Worried About Charity fundraiser in the Last Year:   . Arboriculturist in the Last Year:   Transportation Needs: No Transportation Needs  . Lack of Transportation (Medical): No  . Lack of Transportation (Non-Medical): No  Physical Activity:   . Days of Exercise per Week:   . Minutes of Exercise per Session:   Stress: Stress Concern Present  . Feeling of Stress : To some extent  Social Connections:   . Frequency of Communication with Friends and Family:   . Frequency of Social Gatherings with Friends and Family:   . Attends Religious Services:   . Active Member of Clubs or Organizations:   . Attends Archivist Meetings:   Marland Kitchen Marital Status:     Review of Systems: A 12 point ROS discussed and pertinent positives are indicated in the HPI above.  All other systems are negative.  Review of Systems  Constitutional: Positive for appetite change and unexpected weight change. Negative for activity change, fatigue and fever.  Respiratory: Positive for shortness of breath. Negative for cough.   Cardiovascular: Negative for chest pain.  Gastrointestinal: Positive for abdominal pain and nausea.  Psychiatric/Behavioral: Negative for behavioral problems and  confusion.    Vital Signs: BP 99/68   Pulse 61   Temp 97.6 F (36.4 C) (Oral)   Resp 16   Ht 5\' 10"  (1.778 m)   Wt 210 lb (95.3 kg)   SpO2 98%   BMI 30.13 kg/m   Physical Exam Cardiovascular:     Rate and Rhythm: Regular rhythm.     Heart sounds: Normal heart sounds.  Pulmonary:     Effort: Pulmonary effort is normal.     Breath sounds: Normal breath sounds.  Abdominal:     General: There is distension.     Tenderness: There is no abdominal tenderness.  Musculoskeletal:        General: Normal range of motion.  Skin:    General: Skin is warm and dry.  Neurological:     Mental Status: He is alert and oriented to person, place, and time.  Psychiatric:        Behavior: Behavior normal.        Thought Content: Thought content normal.        Judgment: Judgment normal.     Imaging: CT ABDOMEN PELVIS W CONTRAST  Result Date: 07/06/2019 CLINICAL DATA:  Lower abdominal pain. EXAM: CT ABDOMEN AND PELVIS WITH CONTRAST TECHNIQUE: Multidetector CT imaging of the abdomen and pelvis was performed using the standard protocol following bolus administration of intravenous contrast. CONTRAST:  135mL OMNIPAQUE IOHEXOL 300 MG/ML  SOLN COMPARISON:  April 23, 2005 FINDINGS: Lower chest: Multiple 2 mm and 3 mm noncalcified lung nodules are seen within the posterior aspect of the left lung base. 4 mm and 8 mm noncalcified lung nodules are seen within the right middle lobe. These are not identified on the prior study. An additional 6 mm noncalcified anterolateral right middle lobe lung nodule is seen (axial CT image 21, CT series number 4). Multiple pleural based areas of low attenuation are seen along the posterior aspect of the right lung base. The largest measures approximately 1.8 cm in length (axial CT image 55, CT series number 4). A similar appearing 1.3 cm pleural based area of low attenuation is seen within the posterolateral aspect of the left lung base (axial CT image 33, CT series number  4). Hepatobiliary: A 2.1 cm x 1.6 cm lobulated focus of low attenuation is seen within the anterior aspect of the right lobe of the liver (axial CT image 14, CT series number 2). An additional 0.4 cm diameter focus of low attenuation is seen in this region. Status post cholecystectomy. No biliary dilatation.  Pancreas: Unremarkable. No pancreatic ductal dilatation. Multiple subcentimeter mesenteric lymph nodes are seen anterior and inferior to the body of the pancreas. A mild amount of surrounding nonspecific mesenteric inflammatory fat stranding is seen. Spleen: Normal in size without focal abnormality. Adrenals/Urinary Tract: A 1.9 cm x 1.6 cm isodense right adrenal mass is seen. An additional 2.4 cm x 1.7 cm low-attenuation right adrenal mass is noted. These are seen on the prior study and mildly increased in size on the current exam. Kidneys are normal, without renal calculi, focal lesion, or hydronephrosis. Bladder is unremarkable. Stomach/Bowel: A 2.7 cm x 2.0 cm low-attenuation soft tissue mass is seen within the posterior aspect of the gastric fundus (axial CT image 26, CT series number 2). A portion of this mass contains fat (approximately -29.67 Hounsfield units) and is stable in appearance when compared to the prior study. Appendix appears normal. No evidence of bowel wall thickening, distention, or inflammatory changes. Noninflamed diverticula are seen within the proximal sigmoid colon. Multiple tiny soft tissue nodules are seen within the mesentery of the anterior and lateral aspects of the mid to upper abdomen on the left. An additional 2.4 cm x 1.6 cm area of soft tissue attenuation is seen within the lateral aspect of the left lower quadrant, posterior to the junction of the descending colon and sigmoid colon. Numerous tiny mesenteric soft tissue nodules are also seen within the pelvis. Vascular/Lymphatic: Moderate severity aortic atherosclerosis. No enlarged abdominal or pelvic lymph nodes.  Reproductive: The prostate gland is mildly enlarged. Mild to moderate severity prostate gland calcification is also seen. Other: There is a mild amount of para-aortic and aortocaval lymphadenopathy. A trace amount of posterior pelvic free fluid is seen. Musculoskeletal: Multilevel degenerative changes seen throughout the lumbar spine. IMPRESSION: 1. Stable 2.7 cm x 2.0 cm low-attenuation, partially fat attenuation soft tissue mass within the posterior aspect of the gastric fundus. 2. Numerous tiny soft tissue nodules within the mesentery of the pelvis, mid to upper abdomen on the left and posterior to the junction of the descending colon and sigmoid colon. These findings are concerning for metastatic disease. 3. Foci of low attenuation within the anterior aspect of the right lobe of the liver. While these may represent small hemangiomas, an underlying neoplastic process cannot be excluded. 4. Multiple pleural based areas of low attenuation along the posterior aspect of the right lung base, as well as multiple noncalcified lung nodules in the right middle lobe and left lung base. These findings are concerning for metastatic disease. 5. Mild para-aortic and aortocaval lymphadenopathy. 6. Noninflamed sigmoid diverticulosis. 7. Mild to moderate severity prostate gland calcification. Recommend correlation with PSA. 8. Moderate severity aortic atherosclerosis. Aortic Atherosclerosis (ICD10-I70.0). Electronically Signed   By: Virgina Norfolk M.D.   On: 07/06/2019 20:40   Cardiac event monitor  Result Date: 07/17/2019  Sinus rhythm with PACs and PVCs. There were several paroxysms of SVT and possibly SVT with aberrancy.    NM Myocar Multi W/Spect W/Wall Motion / EF  Result Date: 07/10/2019  There was no ST segment deviation noted during stress.  No T wave inversion was noted during stress.  Findings consistent with moderate size prior inferior/inferoseptal myocardial infarction with mild peri-infarct ischemia.   This is a low risk study.  The left ventricular ejection fraction is normal (55-65%).    NM PET Image Initial (PI) Skull Base To Thigh  Result Date: 07/28/2019 CLINICAL DATA:  Initial treatment strategy for thoracic lymphadenopathy. EXAM: NUCLEAR MEDICINE PET SKULL BASE TO THIGH  TECHNIQUE: 13.6 mCi F-18 FDG was injected intravenously. Full-ring PET imaging was performed from the skull base to thigh after the radiotracer. CT data was obtained and used for attenuation correction and anatomic localization. Fasting blood glucose: 100 mg/dl COMPARISON:  CTA chest 06/15/2019.  Abdomen and pelvis CT 07/06/2019 FINDINGS: Mediastinal blood pool activity: SUV max 3.1 Liver activity: SUV max NA NECK: No hypermetabolic lymph nodes in the neck. Incidental CT findings: none CHEST: Hypermetabolic mediastinal lymphadenopathy evident. 2.1 cm short axis right paratracheal node on 113/3 demonstrates SUV max = 3.3. 1.7 cm short axis anterior mediastinal node on the same image demonstrates SUV max = 3.7. No hypermetabolic lymphadenopathy seen in either axillary region. Focal hypermetabolism in the right hilum demonstrates SUV max = 4.3. Hypermetabolic subpleural nodules are seen in both lungs. Index 9 mm pleural-based nodule in the right upper lobe (121/3) demonstrates SUV max = 3.8. 1.5 cm paraspinal right lower lobe subpleural nodule demonstrates SUV max = 5.2. 2 cm posterior left lower lobe subpleural nodule (164/3) demonstrates SUV max = 6.1. Scattered tiny pulmonary nodules are below the size threshold for reliable resolution on PET imaging. Incidental CT findings: Coronary artery calcification is evident. Atherosclerotic calcification is noted in the wall of the thoracic aorta. ABDOMEN/PELVIS: 2.2 cm lesion anterior hepatic dome ([id number]) is hypermetabolic with SUV max = 7.1. Background mottled liver uptake evident, but likely additional tiny hypermetabolic liver lesions. 2 cm right adrenal nodule is hypermetabolic with SUV max  = 6.2. There is hypermetabolic lymphadenopathy in the upper abdomen with 14 mm short axis pre caval node (194/3) demonstrating SUV max = 5.6 Omental nodularity, nearly confluent in some regions (see image [id number]) is hypermetabolic. Marked hypermetabolism is seen in the right colon with SUV max = 11.5. This corresponds to a region of irregular wall thickening in the ascending colon, partially obscured by motion (image 212/series 3). There is an area of wall thickening showing low attenuation/fat attenuation in the gastric fundus, as described on previous CT scan. This region of the stomach shows no substantial hypermetabolic FDG uptake. Incidental CT findings: There is abdominal aortic atherosclerosis without aneurysm. Small volume intraperitoneal free fluid. SKELETON: Numerous hypermetabolic bone metastases are evident involve an spine, ribs, scapula day, and bony pelvis. Index lesion in the right sacrum demonstrates SUV max = 6.4. Incidental CT findings: Degenerative changes noted lumbar spine. IMPRESSION: 1. Widespread hypermetabolic metastatic disease in the chest, abdomen, and pelvis. This includes mediastinal and potentially right hilar lymphadenopathy, bilateral subpleural lung nodules, hepatic lesion(s), adrenal disease, abdominal lymphadenopathy omental nodularity, and bone metastases. Numerous tiny pulmonary nodules are too small to characterize by PET imaging. There is focal hypermetabolism in the ascending colon and although not well demonstrated on today's study or the previous diagnostic abdomen/pelvis CT, a suggestion of irregular wall thickening in this region raises the question of primary neoplasm of the right colon. No other definite primary source identified on this study. 2.  Aortic Atherosclerois (ICD10-170.0) Electronically Signed   By: Misty Stanley M.D.   On: 07/28/2019 09:36   ECHOCARDIOGRAM COMPLETE  Result Date: 07/10/2019    ECHOCARDIOGRAM REPORT   Patient Name:   PRENTIS DEPERALTA Date  of Exam: 07/10/2019 Medical Rec #:  [id number]       Height:       73.0 in Accession #:    [id number]      Weight:       223.0 lb Date of Birth:  11-27-1945       BSA:  2.254 m Patient Age:    52 years        BP:           126/87 mmHg Patient Gender: M               HR:           88 bpm. Exam Location:  [facility] Procedure: 2D Echo, Cardiac Doppler and Color Doppler Indications:    R00.2 (ICD-10-CM) - Palpitations                 R06.00 (ICD-10-CM) - DOE (dyspnea on exertion)  History:        Patient has no prior history of Echocardiogram examinations.                 Risk Factors:Hypertension. Renal disorder (From Hx).  Sonographer:    BW Referring Phys: [id number]  Sonographer Comments: Subcostal windows are difficult to image IMPRESSIONS  1. Left ventricular ejection fraction, by estimation, is 60 to 65%. The left ventricle has normal function. The left ventricle has no regional wall motion abnormalities. There is mild left ventricular hypertrophy. Left ventricular diastolic parameters are consistent with Grade I diastolic dysfunction (impaired relaxation).  2. Right ventricular systolic function is normal. The right ventricular size is normal. There is moderately elevated pulmonary artery systolic pressure.  3. The mitral valve is normal in structure. Trivial mitral valve regurgitation. No evidence of mitral stenosis.  4. The aortic valve is tricuspid. Aortic valve regurgitation is not visualized. No aortic stenosis is present. FINDINGS  Left Ventricle: Left ventricular ejection fraction, by estimation, is 60 to 65%. The left ventricle has normal function. The left ventricle has no regional wall motion abnormalities. The left ventricular internal cavity size was normal in size. There is  mild left ventricular hypertrophy. Left ventricular diastolic parameters are consistent with Grade I diastolic dysfunction (impaired relaxation). Normal left ventricular filling pressure. Right Ventricle: The  right ventricular size is normal. No increase in right ventricular wall thickness. Right ventricular systolic function is normal. There is moderately elevated pulmonary artery systolic pressure. The tricuspid regurgitant velocity is 2.80 m/s, and with an assumed right atrial pressure of 10 mmHg, the estimated right ventricular systolic pressure is 123456 mmHg. Left Atrium: Left atrial size was normal in size. Right Atrium: Right atrial size was normal in size. Pericardium: There is no evidence of pericardial effusion. Mitral Valve: The mitral valve is normal in structure. Trivial mitral valve regurgitation. No evidence of mitral valve stenosis. Tricuspid Valve: The tricuspid valve is normal in structure. Tricuspid valve regurgitation is mild . No evidence of tricuspid stenosis. Aortic Valve: The aortic valve is tricuspid. Aortic valve regurgitation is not visualized. No aortic stenosis is present. Aortic valve mean gradient measures 4.3 mmHg. Aortic valve peak gradient measures 10.9 mmHg. Aortic valve area, by VTI measures 3.24  cm. Pulmonic Valve: The pulmonic valve was not well visualized. Pulmonic valve regurgitation is not visualized. No evidence of pulmonic stenosis. Aorta: The aortic root is normal in size and structure. Pulmonary Artery: Indeterminant PASP, inadequate TR jet. Venous: The inferior vena cava was not well visualized. IAS/Shunts: The interatrial septum was not well visualized.  LEFT VENTRICLE PLAX 2D LVIDd:         4.19 cm  Diastology LVIDs:         2.86 cm  LV e' lateral:   11.00 cm/s LV PW:         1.13 cm  LV E/e'  lateral: 5.4 LV IVS:        1.10 cm  LV e' medial:    6.31 cm/s LVOT diam:     2.00 cm  LV E/e' medial:  9.4 LV SV:         81 LV SV Index:   36 LVOT Area:     3.14 cm  RIGHT VENTRICLE RV Mid diam:    3.33 cm RV S prime:     14.40 cm/s TAPSE (M-mode): 2.2 cm LEFT ATRIUM             Index LA diam:        3.30 cm 1.46 cm/m LA Vol (A2C):   48.5 ml 21.52 ml/m LA Vol (A4C):   36.4 ml  16.15 ml/m LA Biplane Vol: 44.9 ml 19.92 ml/m  AORTIC VALVE AV Area (Vmax):    2.65 cm AV Area (Vmean):   3.01 cm AV Area (VTI):     3.24 cm AV Vmax:           164.71 cm/s AV Vmean:          92.755 cm/s AV VTI:            0.251 m AV Peak Grad:      10.9 mmHg AV Mean Grad:      4.3 mmHg LVOT Vmax:         139.00 cm/s LVOT Vmean:        89.000 cm/s LVOT VTI:          0.259 m LVOT/AV VTI ratio: 1.03  AORTA Ao Root diam: 3.80 cm MITRAL VALVE               TRICUSPID VALVE MV Area (PHT): 2.69 cm    TR Peak grad:   31.4 mmHg MV Decel Time: 282 msec    TR Vmax:        280.00 cm/s MV E velocity: 59.50 cm/s MV A velocity: 75.20 cm/s  SHUNTS MV E/A ratio:  0.79        Systemic VTI:  0.26 m                            Systemic Diam: 2.00 cm Carlyle Dolly MD Electronically signed by Carlyle Dolly MD Signature Date/Time: 07/10/2019/4:49:50 PM    Final     Labs:  CBC: Recent Labs    06/15/19 1654 07/06/19 1823  WBC 9.1 12.8*  HGB 12.4* 11.9*  HCT 41.0 38.1*  PLT 345 554*    COAGS: Recent Labs    08/04/19 1125  INR 1.2    BMP: Recent Labs    06/15/19 1654 07/06/19 1823  NA 134* 132*  K 3.8 4.1  CL 101 95*  CO2 22 25  GLUCOSE 140* 118*  BUN 17 18  CALCIUM 8.6* 8.9  CREATININE 1.02 1.09  GFRNONAA >60 >60  GFRAA >60 >60    LIVER FUNCTION TESTS: Recent Labs    06/15/19 1854 07/06/19 1823  BILITOT 0.4 0.3  AST 25 30  ALT 20 22  ALKPHOS 75 69  PROT 7.4 8.1  ALBUMIN 3.6 3.5    TUMOR MARKERS: No results for input(s): AFPTM, CEA, CA199, CHROMGRNA in the last 8760 hours.  Assessment and Plan:  Nause; abd pain Wt loss Work up included imaging revealing widespread metastasis Need tissue diagnosis per Dr Delton Coombes Scheduled for liver lesion biopsy Risks and benefits of liver lesion bx was discussed with the patient  and/or patient's family including, but not limited to bleeding, infection, damage to adjacent structures or low yield requiring additional tests.  All of the  questions were answered and there is agreement to proceed. Consent signed and in chart.   Thank you for this interesting consult.  I greatly enjoyed meeting Richard Conway and look forward to participating in their care.  A copy of this report was sent to the requesting provider on this date.  Electronically Signed: Lavonia Drafts, PA-C 08/04/2019, 12:22 PM   I spent a total of  30 Minutes   in face to face in clinical consultation, greater than 50% of which was counseling/coordinating care for liver lesion bx

## 2019-08-05 DIAGNOSIS — R918 Other nonspecific abnormal finding of lung field: Secondary | ICD-10-CM | POA: Diagnosis not present

## 2019-08-05 DIAGNOSIS — C771 Secondary and unspecified malignant neoplasm of intrathoracic lymph nodes: Secondary | ICD-10-CM | POA: Diagnosis not present

## 2019-08-05 DIAGNOSIS — K219 Gastro-esophageal reflux disease without esophagitis: Secondary | ICD-10-CM | POA: Diagnosis not present

## 2019-08-05 DIAGNOSIS — D508 Other iron deficiency anemias: Secondary | ICD-10-CM | POA: Diagnosis not present

## 2019-08-06 ENCOUNTER — Inpatient Hospital Stay (HOSPITAL_COMMUNITY): Payer: Medicare Other | Attending: Hematology | Admitting: Hematology

## 2019-08-06 ENCOUNTER — Other Ambulatory Visit: Payer: Self-pay

## 2019-08-06 DIAGNOSIS — C7972 Secondary malignant neoplasm of left adrenal gland: Secondary | ICD-10-CM | POA: Diagnosis not present

## 2019-08-06 DIAGNOSIS — C778 Secondary and unspecified malignant neoplasm of lymph nodes of multiple regions: Secondary | ICD-10-CM | POA: Diagnosis not present

## 2019-08-06 DIAGNOSIS — C787 Secondary malignant neoplasm of liver and intrahepatic bile duct: Secondary | ICD-10-CM | POA: Insufficient documentation

## 2019-08-06 DIAGNOSIS — C801 Malignant (primary) neoplasm, unspecified: Secondary | ICD-10-CM | POA: Diagnosis not present

## 2019-08-06 DIAGNOSIS — C7800 Secondary malignant neoplasm of unspecified lung: Secondary | ICD-10-CM | POA: Insufficient documentation

## 2019-08-06 DIAGNOSIS — C7971 Secondary malignant neoplasm of right adrenal gland: Secondary | ICD-10-CM | POA: Insufficient documentation

## 2019-08-06 DIAGNOSIS — C7951 Secondary malignant neoplasm of bone: Secondary | ICD-10-CM | POA: Diagnosis not present

## 2019-08-06 DIAGNOSIS — R0602 Shortness of breath: Secondary | ICD-10-CM | POA: Diagnosis not present

## 2019-08-06 DIAGNOSIS — Z87891 Personal history of nicotine dependence: Secondary | ICD-10-CM | POA: Insufficient documentation

## 2019-08-06 DIAGNOSIS — R970 Elevated carcinoembryonic antigen [CEA]: Secondary | ICD-10-CM | POA: Insufficient documentation

## 2019-08-06 DIAGNOSIS — C771 Secondary and unspecified malignant neoplasm of intrathoracic lymph nodes: Secondary | ICD-10-CM

## 2019-08-06 DIAGNOSIS — Z800 Family history of malignant neoplasm of digestive organs: Secondary | ICD-10-CM | POA: Diagnosis not present

## 2019-08-06 DIAGNOSIS — R002 Palpitations: Secondary | ICD-10-CM | POA: Diagnosis not present

## 2019-08-06 DIAGNOSIS — K5900 Constipation, unspecified: Secondary | ICD-10-CM | POA: Diagnosis not present

## 2019-08-06 NOTE — Assessment & Plan Note (Signed)
1.  Widespread metastatic disease: -Presentation to ER with chest pain on 06/15/2019, CT chest PE protocol showed mediastinal, left supraclavicular, upper abdominal adenopathy with bilateral pulmonary nodules measuring up to 13 mm. -CT of the abdomen on 07/06/2019 showed 2.7 x 2.0 cm soft tissue mass within the posterior aspect of the gastric fundus, numerous soft tissue nodules within the mesentery of the pelvis, low-attenuation lesion within the anterior aspect right lobe of the liver, multiple pleural-based areas of low-attenuation along the posterior aspect of the right lung base, mild para-aortic and aortocaval lymphadenopathy. -Patient reportedly lost about 33 pounds in the last 3 months due to decreased appetite. -He never had a colonoscopy.  Denies any bleeding per rectum or melena.  Occasional nausea but no vomiting. -He has baseline constipation and denies any diarrhea. -PET CT scan on 07/27/2019 showed widespread hypermetabolic metastatic disease in the chest, abdomen and pelvis including mediastinal and potentially right hilar adenopathy, bilateral subpleural lung nodules, hepatic lesion, right adrenal uptake, abdominal lymphadenopathy, omental nodularity and bone metastasis. -CEA mildly elevated at 9.5. -He was evaluated by interventional radiology.  Liver lesion was not easily accessible for biopsy.  Omental nodules are also not easily accessible for biopsy. -I will make a referral to GI for colonoscopy and biopsy as soon as possible.  I will send a message to our GI team.  2.  Family history: -Sister died of colon cancer at age 72. -Paternal grandfather had colon cancer.  3.  Palpitations: -He will continue metoprolol 25 mg twice daily for tachycardia and palpitations.

## 2019-08-06 NOTE — Patient Instructions (Addendum)
[place] at [facility] Discharge Instructions  You were seen today by Dr. Delton Coombes. He went over your recent labs and test results. He will refer you to the GI doctor to have a colonoscopy and biopsy. He will see you back after the colonoscopy and biopsy is done.  Thank you for choosing [place] at [facility] to provide your oncology and hematology care.  To afford each patient quality time with our provider, please arrive at least 15 minutes before your scheduled appointment time.   If you have a lab appointment with the [facility] please come in thru the  Main Entrance and check in at the main information desk  You need to re-schedule your appointment should you arrive 10 or more minutes late.  We strive to give you quality time with our providers, and arriving late affects you and other patients whose appointments are after yours.  Also, if you no show three or more times for appointments you may be dismissed from the clinic at the providers discretion.     Again, thank you for choosing [facility].  Our hope is that these requests will decrease the amount of time that you wait before being seen by our physicians.       _____________________________________________________________  Should you have questions after your visit to [facility], please contact our office at [phone] between the hours of 8:00 a.m. and 4:30 p.m.  Voicemails left after 4:00 p.m. will not be returned until the following business day.  For prescription refill requests, have your pharmacy contact our office and allow 72 hours.    Cancer Center Support Programs:   > Cancer Support Group  2nd Tuesday of the month 1pm-2pm, Journey Room

## 2019-08-06 NOTE — Progress Notes (Signed)
[place], [place] [address]   CLINIC:  Medical Oncology/Hematology  PCP:  Celene Squibb, MD [place] [address] [phone]   REASON FOR VISIT:  Follow-up for metastatic disease.   CURRENT THERAPY: Under work-up.  BRIEF ONCOLOGIC HISTORY:  Oncology History   No history exists.    CANCER STAGING: Cancer Staging No matching staging information was found for the patient.   INTERVAL HISTORY:  Mr. Scherff 74 y.o. male seen for follow-up of metastatic carcinoma.  He was evaluated by interventional radiology.  Biopsy could not be done because of lesion on the liver could not be located by ultrasound.  Reports appetite of 50%.  Energy levels are low.  He has constipation at times.  Also has occasional nausea.  Shortness of breath on exertion is stable.  Denies any bleeding per rectum or melena.  He has never had a colonoscopy.    REVIEW OF SYSTEMS:  Review of Systems  Respiratory: Positive for shortness of breath.   Gastrointestinal: Positive for constipation.  All other systems reviewed and are negative.    PAST MEDICAL/SURGICAL HISTORY:  Past Medical History:  Diagnosis Date  . Arthritis   . Hypertension   . Renal disorder    kidney stones   Past Surgical History:  Procedure Laterality Date  . CHOLECYSTECTOMY    . KNEE SURGERY    . LITHOTRIPSY       SOCIAL HISTORY:  Social History   Socioeconomic History  . Marital status: Married    Spouse name: Not on file  . Number of children: 3  . Years of education: Not on file  . Highest education level: Not on file  Occupational History  . Occupation: RETIRED  Tobacco Use  . Smoking status: Former Smoker    Quit date: 1985    Years since quitting: 36.3  . Smokeless tobacco: Never Used  Substance and Sexual Activity  . Alcohol use: No  . Drug use: No  . Sexual activity: Not Currently  Other Topics Concern  . Not on file  Social History  Narrative  . Not on file   Social Determinants of Health   Financial Resource Strain: Low Risk   . Difficulty of Paying Living Expenses: Not very hard  Food Insecurity:   . Worried About Charity fundraiser in the Last Year:   . Arboriculturist in the Last Year:   Transportation Needs: No Transportation Needs  . Lack of Transportation (Medical): No  . Lack of Transportation (Non-Medical): No  Physical Activity:   . Days of Exercise per Week:   . Minutes of Exercise per Session:   Stress: Stress Concern Present  . Feeling of Stress : To some extent  Social Connections:   . Frequency of Communication with Friends and Family:   . Frequency of Social Gatherings with Friends and Family:   . Attends Religious Services:   . Active Member of Clubs or Organizations:   . Attends Archivist Meetings:   Marland Kitchen Marital Status:   Intimate Partner Violence: Not At Risk  . Fear of Current or Ex-Partner: No  . Emotionally Abused: No  . Physically Abused: No  . Sexually Abused: No    FAMILY HISTORY:  Family History  Problem Relation Age of Onset  . Emphysema Mother   . Stroke Father   . Cirrhosis Sister   . Heart disease Maternal Grandfather   . Diabetes  Paternal Grandmother   . Colon cancer Paternal Grandfather   . Emphysema Sister   . Colon cancer Sister   . Emphysema Brother   . Thyroid disease Daughter   . Glaucoma Daughter   . Heart disease Son   . Gout Son   . Hypertension Son     CURRENT MEDICATIONS:  Outpatient Encounter Medications as of 08/06/2019  Medication Sig  . aspirin EC 81 MG tablet Take 1 tablet (81 mg total) by mouth daily.  Marland Kitchen atorvastatin (LIPITOR) 40 MG tablet Take 1 tablet (40 mg total) by mouth daily.  . benazepril (LOTENSIN) 10 MG tablet Take 10 mg by mouth daily.  Marland Kitchen gabapentin (NEURONTIN) 300 MG capsule Take 300 mg by mouth 3 (three) times daily.  . Glucosamine HCl (GLUCOSAMINE PO) Take 1 tablet by mouth daily.  Marland Kitchen lisinopril-hydrochlorothiazide  (ZESTORETIC) 10-12.5 MG tablet Take 1 tablet by mouth daily.  Marland Kitchen LORazepam (ATIVAN) 0.5 MG tablet Take 0.5 mg by mouth at bedtime.  . meloxicam (MOBIC) 15 MG tablet Take 15 mg by mouth daily.   . metoprolol tartrate (LOPRESSOR) 25 MG tablet Take 1 tablet (25 mg total) by mouth 2 (two) times daily.  . Multiple Vitamin (MULTIVITAMIN WITH MINERALS) TABS tablet Take 1 tablet by mouth daily.  . Omega-3 Fatty Acids (FISH OIL) 500 MG CAPS Take 1 capsule by mouth 2 (two) times daily.  Marland Kitchen omeprazole (PRILOSEC) 40 MG capsule Take 40 mg by mouth daily.  . pantoprazole (PROTONIX) 40 MG tablet Take 40 mg by mouth daily.  . sucralfate (CARAFATE) 1 g tablet Take 1 g by mouth 4 (four) times daily -  with meals and at bedtime.   Marland Kitchen acetaminophen (TYLENOL) 325 MG tablet Take 650 mg by mouth every 4 (four) hours as needed for mild pain.  Marland Kitchen HYDROcodone-acetaminophen (NORCO/VICODIN) 5-325 MG tablet Take 1 tablet by mouth 2 (two) times daily as needed for moderate pain.   Marland Kitchen ondansetron (ZOFRAN) 8 MG tablet Take 8 mg by mouth every 8 (eight) hours as needed for nausea or vomiting.   . promethazine (PHENERGAN) 25 MG tablet Take 25 mg by mouth every 6 (six) hours as needed for nausea or vomiting.   . traMADol (ULTRAM) 50 MG tablet Take 50 mg by mouth every 6 (six) hours as needed for moderate pain.    No facility-administered encounter medications on file as of 08/06/2019.    ALLERGIES:  Allergies  Allergen Reactions  . Advil [Ibuprofen] Other (See Comments)    Face and body numbness  . Nyquil Multi-Symptom [Pseudoeph-Doxylamine-Dm-Apap] Other (See Comments)    Increase in bp      PHYSICAL EXAM:  ECOG Performance status: 1  Vitals:   08/06/19 1333 08/06/19 1334  BP: (!) 84/59 (!) 90/53  Pulse: 66   Resp: 18   Temp: 97.7 F (36.5 C)   SpO2: 98%    Filed Weights   08/06/19 1333  Weight: 200 lb 1.6 oz (90.8 kg)   Physical Exam Vitals reviewed.  Constitutional:      Appearance: Normal appearance.    Pulmonary:     Effort: Pulmonary effort is normal.     Breath sounds: Normal breath sounds.  Neurological:     General: No focal deficit present.     Mental Status: He is alert and oriented to person, place, and time.  Psychiatric:        Mood and Affect: Mood normal.        Behavior: Behavior normal.  LABORATORY DATA:  I have reviewed the labs as listed.  CBC    Component Value Date/Time   WBC 12.8 (H) 07/06/2019 1823   RBC 4.19 (L) 07/06/2019 1823   HGB 11.9 (L) 07/06/2019 1823   HCT 38.1 (L) 07/06/2019 1823   PLT 554 (H) 07/06/2019 1823   MCV 90.9 07/06/2019 1823   MCH 28.4 07/06/2019 1823   MCHC 31.2 07/06/2019 1823   RDW 14.4 07/06/2019 1823   LYMPHSABS 1.8 07/06/2019 1823   MONOABS 1.0 07/06/2019 1823   EOSABS 0.3 07/06/2019 1823   BASOSABS 0.0 07/06/2019 1823   CMP Latest Ref Rng & Units 07/06/2019 06/15/2019 11/01/2012  Glucose 70 - 99 mg/dL 118(H) 140(H) 117(H)  BUN 8 - 23 mg/dL 18 17 11   Creatinine 0.61 - 1.24 mg/dL 1.09 1.02 0.67  Sodium 135 - 145 mmol/L 132(L) 134(L) 138  Potassium 3.5 - 5.1 mmol/L 4.1 3.8 3.6  Chloride 98 - 111 mmol/L 95(L) 101 102  CO2 22 - 32 mmol/L 25 22 23   Calcium 8.9 - 10.3 mg/dL 8.9 8.6(L) 9.3  Total Protein 6.5 - 8.1 g/dL 8.1 7.4 -  Total Bilirubin 0.3 - 1.2 mg/dL 0.3 0.4 -  Alkaline Phos 38 - 126 U/L 69 75 -  AST 15 - 41 U/L 30 25 -  ALT 0 - 44 U/L 22 20 -    DIAGNOSTIC IMAGING:  I have independently reviewed the scans and discussed with the patient.  ASSESSMENT & PLAN:  Metastatic cancer ([place]) 1.  Widespread metastatic disease: -Presentation to ER with chest pain on 06/15/2019, CT chest PE protocol showed mediastinal, left supraclavicular, upper abdominal adenopathy with bilateral pulmonary nodules measuring up to 13 mm. -CT of the abdomen on 07/06/2019 showed 2.7 x 2.0 cm soft tissue mass within the posterior aspect of the gastric fundus, numerous soft tissue nodules within the mesentery of the pelvis, low-attenuation  lesion within the anterior aspect right lobe of the liver, multiple pleural-based areas of low-attenuation along the posterior aspect of the right lung base, mild para-aortic and aortocaval lymphadenopathy. -Patient reportedly lost about 33 pounds in the last 3 months due to decreased appetite. -He never had a colonoscopy.  Denies any bleeding per rectum or melena.  Occasional nausea but no vomiting. -He has baseline constipation and denies any diarrhea. -PET CT scan on 07/27/2019 showed widespread hypermetabolic metastatic disease in the chest, abdomen and pelvis including mediastinal and potentially right hilar adenopathy, bilateral subpleural lung nodules, hepatic lesion, right adrenal uptake, abdominal lymphadenopathy, omental nodularity and bone metastasis. -CEA mildly elevated at 9.5. -He was evaluated by interventional radiology.  Liver lesion was not easily accessible for biopsy.  Omental nodules are also not easily accessible for biopsy. -I will make a referral to GI for colonoscopy and biopsy as soon as possible.  I will send a message to our GI team.  2.  Family history: -Sister died of colon cancer at age 50. -Paternal grandfather had colon cancer.  3.  Palpitations: -He will continue metoprolol 25 mg twice daily for tachycardia and palpitations.     Orders placed this encounter:  No orders of the defined types were placed in this encounter.     Derek Jack, MD [place] [phone]

## 2019-08-11 ENCOUNTER — Ambulatory Visit (HOSPITAL_COMMUNITY): Payer: Medicare Other | Admitting: Hematology

## 2019-08-12 ENCOUNTER — Encounter: Payer: Self-pay | Admitting: *Deleted

## 2019-08-12 ENCOUNTER — Ambulatory Visit: Payer: Medicare Other | Admitting: Gastroenterology

## 2019-08-12 ENCOUNTER — Encounter: Payer: Self-pay | Admitting: Gastroenterology

## 2019-08-12 ENCOUNTER — Other Ambulatory Visit: Payer: Self-pay

## 2019-08-12 ENCOUNTER — Telehealth: Payer: Self-pay | Admitting: *Deleted

## 2019-08-12 ENCOUNTER — Other Ambulatory Visit: Payer: Self-pay | Admitting: *Deleted

## 2019-08-12 DIAGNOSIS — R948 Abnormal results of function studies of other organs and systems: Secondary | ICD-10-CM | POA: Diagnosis not present

## 2019-08-12 MED ORDER — CLENPIQ 10-3.5-12 MG-GM -GM/160ML PO SOLN: 1.0000 | Freq: Once | ORAL | 0 refills | Status: AC

## 2019-08-12 NOTE — Telephone Encounter (Signed)
LMOVM for pt to call back to advise pre-op/covid test scheduled for 5/28 at 11:00am at the [facility]

## 2019-08-12 NOTE — Patient Instructions (Addendum)
We are arranging a colonoscopy as soon as possible with Dr. Gala Romney.  Please take either Miralax or milk of magnesia EVERY evening to avoid constipation and keep things soft/moving. Cut back to every other day if you start having diarrhea.  You can supplement your diet by adding Boost or ensure twice a day as snacks.   Further recommendations to follow  It was a pleasure to see you today. I want to create trusting relationships with patients to provide genuine, compassionate, and quality care. I value your feedback. If you receive a survey regarding your visit,  I greatly appreciate you taking time to fill this out.   Annitta Needs, PhD, ANP-BC [facility]

## 2019-08-12 NOTE — H&P (View-Only) (Signed)
Primary Care Physician:  Celene Squibb, MD Primary Gastroenterologist:  Dr. Gala Romney   Chief Complaint  Patient presents with  . Abdominal Pain    Gets sick after eating, food lays on stomach, wt loss    HPI:   Richard Conway is a 74 y.o. male presenting today at the request of Dr. Nevada Crane due to abdominal pain. In March 2021, he presented to the ED with chest pain. Chest CT showed mediastinal adenopathy and bilateral lung nodules. CT scan done in April 2021 due to persistent pain with soft tissue mass within posterior aspect of gastric fundus, numerous tiny soft tissue nodules within mesentery of mid pelvis, mid to upper abdomen on left and posterior to junction of descending colon and sigmoid. Concern for low attentuations foci within anterior aspect of right lobe of liver. PET with widespread metastatic disease in chest, abdomen, pelvis. Concern for primary neoplasm of right colon. Dr. Delton Coombes has reached out regarding need for colonoscopy as soon as possible to help assist in diagnosis. CEA elevated at 9.4. IR not able to biopsy liver lesion as not accessible. Omental nodules not easily accessible. Oncology requesting colonoscopy with biopsy to help with diagnostic purposes.   Richard Conway, his wife, is present today. Married 89 years when patient was 66 and she was 19.   Feels bloated in abdomen. Feels like food isn't digesting when he eats. Early satiety. Associated nausea. No vomiting. No rectal bleeding. No melena. Takes MOM as needed. Tries to have a BM every day but sometimes skips a day. Has to strain if waiting too long. After cholecystectomy in the 90s, had BMs like clockwork. Constipation started 6-8 months ago. +flatus. When has a BM will have "right much in the commode".   Weight 230 in March 2021. Today 196. Baseline weight would be in the 240s.   Sister had colon cancer and deceased at age 36. Paternal grandfather with colon cancer. No prior colonoscopy.   Past Medical History:   Diagnosis Date  . Arthritis   . CAD (coronary artery disease)   . Hypertension   . Renal disorder    kidney stones    Past Surgical History:  Procedure Laterality Date  . CHOLECYSTECTOMY    . KNEE SURGERY    . LITHOTRIPSY      Current Outpatient Medications  Medication Sig Dispense Refill  . acetaminophen (TYLENOL) 325 MG tablet Take 650 mg by mouth as needed for mild pain.     Marland Kitchen aspirin EC 81 MG tablet Take 1 tablet (81 mg total) by mouth daily. 90 tablet 3  . atorvastatin (LIPITOR) 40 MG tablet Take 1 tablet (40 mg total) by mouth daily. 90 tablet 3  . benazepril (LOTENSIN) 10 MG tablet Take 10 mg by mouth daily.    . Cyanocobalamin (B-12 PO) Take by mouth daily.    Marland Kitchen gabapentin (NEURONTIN) 300 MG capsule Take 300 mg by mouth 3 (three) times daily.    . Glucosamine HCl (GLUCOSAMINE PO) Take 1 tablet by mouth daily.    Marland Kitchen HYDROcodone-acetaminophen (NORCO/VICODIN) 5-325 MG tablet Take 1 tablet by mouth 2 (two) times daily as needed for moderate pain.     Marland Kitchen lisinopril-hydrochlorothiazide (ZESTORETIC) 10-12.5 MG tablet Take 1 tablet by mouth daily.    Marland Kitchen LORazepam (ATIVAN) 0.5 MG tablet Take 0.5 mg by mouth at bedtime.    . meloxicam (MOBIC) 15 MG tablet Take 15 mg by mouth daily.     . metoprolol tartrate (LOPRESSOR)  25 MG tablet Take 1 tablet (25 mg total) by mouth 2 (two) times daily. 180 tablet 3  . Multiple Vitamin (MULTIVITAMIN WITH MINERALS) TABS tablet Take 1 tablet by mouth daily.    . Omega-3 Fatty Acids (FISH OIL) 500 MG CAPS Take 1 capsule by mouth daily.     . ondansetron (ZOFRAN) 8 MG tablet Take 8 mg by mouth as needed for nausea or vomiting.     . pantoprazole (PROTONIX) 40 MG tablet Take 40 mg by mouth daily.    . promethazine (PHENERGAN) 25 MG tablet Take 25 mg by mouth as needed for nausea or vomiting.     . sucralfate (CARAFATE) 1 g tablet Take 1 g by mouth 4 (four) times daily -  with meals and at bedtime.      No current facility-administered medications for  this visit.    Allergies as of 08/12/2019 - Review Complete 08/12/2019  Allergen Reaction Noted  . Advil [ibuprofen] Other (See Comments) 11/01/2012  . Nyquil multi-symptom [pseudoeph-doxylamine-dm-apap] Other (See Comments) 04/03/2015    Family History  Problem Relation Age of Onset  . Emphysema Mother   . Stroke Father   . Cirrhosis Sister   . Heart disease Maternal Grandfather   . Diabetes Paternal Grandmother   . Colon cancer Paternal Grandfather   . Emphysema Sister   . Colon cancer Sister   . Emphysema Brother   . Thyroid disease Daughter   . Glaucoma Daughter   . Heart disease Son   . Gout Son   . Hypertension Son     Social History   Socioeconomic History  . Marital status: Married    Spouse name: Not on file  . Number of children: 3  . Years of education: Not on file  . Highest education level: Not on file  Occupational History  . Occupation: RETIRED  Tobacco Use  . Smoking status: Former Smoker    Quit date: 1985    Years since quitting: 36.3  . Smokeless tobacco: Never Used  Substance and Sexual Activity  . Alcohol use: No  . Drug use: No  . Sexual activity: Not Currently  Other Topics Concern  . Not on file  Social History Narrative  . Not on file   Social Determinants of Health   Financial Resource Strain: Low Risk   . Difficulty of Paying Living Expenses: Not very hard  Food Insecurity:   . Worried About Charity fundraiser in the Last Year:   . Arboriculturist in the Last Year:   Transportation Needs: No Transportation Needs  . Lack of Transportation (Medical): No  . Lack of Transportation (Non-Medical): No  Physical Activity:   . Days of Exercise per Week:   . Minutes of Exercise per Session:   Stress: Stress Concern Present  . Feeling of Stress : To some extent  Social Connections:   . Frequency of Communication with Friends and Family:   . Frequency of Social Gatherings with Friends and Family:   . Attends Religious Services:   .  Active Member of Clubs or Organizations:   . Attends Archivist Meetings:   Marland Kitchen Marital Status:   Intimate Partner Violence: Not At Risk  . Fear of Current or Ex-Partner: No  . Emotionally Abused: No  . Physically Abused: No  . Sexually Abused: No    Review of Systems: Gen: see HPI  CV: Denies chest pain, heart palpitations, peripheral edema, syncope.  Resp: +DOE GI: see  HPI GU : Denies urinary burning, urinary frequency, urinary hesitancy MS: Denies joint pain, muscle weakness, cramps, or limitation of movement.  Derm: Denies rash, itching, dry skin Psych: Denies depression, anxiety, memory loss, and confusion Heme: Denies bruising, bleeding, and enlarged lymph nodes.  Physical Exam: BP 99/63   Pulse 77   Temp (!) 96.8 F (36 C) (Temporal)   Ht 6\' 1"  (1.854 m)   Wt 196 lb (88.9 kg)   BMI 25.86 kg/m  General:   Alert and oriented. Pleasant and cooperative. Appears stated age. Somewhat sallow appearing Head:  Normocephalic and atraumatic. Eyes:  Without icterus, sclera clear and conjunctiva pink.  Ears:  Slight hard of hearing Mouth:  Mask in place Lungs:  Clear to auscultation bilaterally.  Heart:  S1, S2 present without murmurs appreciated.  Abdomen:  +BS, soft, non-tender and non-distended. No HSM noted. No guarding or rebound.  Rectal:  Deferred  Msk:  Symmetrical without gross deformities. Normal posture. Extremities:  Without edema. Neurologic:  Alert and  oriented x4 Skin:  Intact without significant lesions or rashes. Psych:  Alert and cooperative. Normal mood and affect.  ASSESSMENT: EFSTATHIOS SCHLAGETER is a 74 y.o. male recently found to have metastatic disease on imaging, with concern for primary neoplasm of right colon. He has already been evaluated by Oncology, who is requesting diagnostic colonoscopy in an expedited plan to guide treatment. Unfortunately, IR was not able to biopsy liver lesion as was not accessible, and omental nodules not easily  accessible. He has associated abdominal bloating, early satiety nausea, and documented significant weight loss. Constipation onset 6-8 months ago, but he does not have any obstructive signs.   Will start on Miralax daily, add extra day of clear liquids prior to colonoscopy in order to ensure appropriate prep.    PLAN:  Proceed with TCS with Dr. Gala Romney in near future with PROPOFOL: the risks, benefits, and alternatives have been discussed with the patient in detail. The patient states understanding and desires to proceed.  Extra 2 days of clear liquids prior  Miralax daily  Add boost/Ensure for snacks  Call if any changes in interim  Annitta Needs, PhD, ANP-BC [facility]

## 2019-08-12 NOTE — Telephone Encounter (Signed)
Called pt. Line busy.

## 2019-08-12 NOTE — Progress Notes (Signed)
Primary Care Physician:  Celene Squibb, MD Primary Gastroenterologist:  Dr. Gala Romney   Chief Complaint  Patient presents with  . Abdominal Pain    Gets sick after eating, food lays on stomach, wt loss    HPI:   Richard Conway is a 74 y.o. male presenting today at the request of Dr. Nevada Crane due to abdominal pain. In March 2021, he presented to the ED with chest pain. Chest CT showed mediastinal adenopathy and bilateral lung nodules. CT scan done in April 2021 due to persistent pain with soft tissue mass within posterior aspect of gastric fundus, numerous tiny soft tissue nodules within mesentery of mid pelvis, mid to upper abdomen on left and posterior to junction of descending colon and sigmoid. Concern for low attentuations foci within anterior aspect of right lobe of liver. PET with widespread metastatic disease in chest, abdomen, pelvis. Concern for primary neoplasm of right colon. Dr. Delton Coombes has reached out regarding need for colonoscopy as soon as possible to help assist in diagnosis. CEA elevated at 9.4. IR not able to biopsy liver lesion as not accessible. Omental nodules not easily accessible. Oncology requesting colonoscopy with biopsy to help with diagnostic purposes.   Richard Conway, his wife, is present today. Married 46 years when patient was 38 and she was 75.   Feels bloated in abdomen. Feels like food isn't digesting when he eats. Early satiety. Associated nausea. No vomiting. No rectal bleeding. No melena. Takes MOM as needed. Tries to have a BM every day but sometimes skips a day. Has to strain if waiting too long. After cholecystectomy in the 90s, had BMs like clockwork. Constipation started 6-8 months ago. +flatus. When has a BM will have "right much in the commode".   Weight 230 in March 2021. Today 196. Baseline weight would be in the 240s.   Sister had colon cancer and deceased at age 71. Paternal grandfather with colon cancer. No prior colonoscopy.   Past Medical History:   Diagnosis Date  . Arthritis   . CAD (coronary artery disease)   . Hypertension   . Renal disorder    kidney stones    Past Surgical History:  Procedure Laterality Date  . CHOLECYSTECTOMY    . KNEE SURGERY    . LITHOTRIPSY      Current Outpatient Medications  Medication Sig Dispense Refill  . acetaminophen (TYLENOL) 325 MG tablet Take 650 mg by mouth as needed for mild pain.     Marland Kitchen aspirin EC 81 MG tablet Take 1 tablet (81 mg total) by mouth daily. 90 tablet 3  . atorvastatin (LIPITOR) 40 MG tablet Take 1 tablet (40 mg total) by mouth daily. 90 tablet 3  . benazepril (LOTENSIN) 10 MG tablet Take 10 mg by mouth daily.    . Cyanocobalamin (B-12 PO) Take by mouth daily.    Marland Kitchen gabapentin (NEURONTIN) 300 MG capsule Take 300 mg by mouth 3 (three) times daily.    . Glucosamine HCl (GLUCOSAMINE PO) Take 1 tablet by mouth daily.    Marland Kitchen HYDROcodone-acetaminophen (NORCO/VICODIN) 5-325 MG tablet Take 1 tablet by mouth 2 (two) times daily as needed for moderate pain.     Marland Kitchen lisinopril-hydrochlorothiazide (ZESTORETIC) 10-12.5 MG tablet Take 1 tablet by mouth daily.    Marland Kitchen LORazepam (ATIVAN) 0.5 MG tablet Take 0.5 mg by mouth at bedtime.    . meloxicam (MOBIC) 15 MG tablet Take 15 mg by mouth daily.     . metoprolol tartrate (LOPRESSOR)  25 MG tablet Take 1 tablet (25 mg total) by mouth 2 (two) times daily. 180 tablet 3  . Multiple Vitamin (MULTIVITAMIN WITH MINERALS) TABS tablet Take 1 tablet by mouth daily.    . Omega-3 Fatty Acids (FISH OIL) 500 MG CAPS Take 1 capsule by mouth daily.     . ondansetron (ZOFRAN) 8 MG tablet Take 8 mg by mouth as needed for nausea or vomiting.     . pantoprazole (PROTONIX) 40 MG tablet Take 40 mg by mouth daily.    . promethazine (PHENERGAN) 25 MG tablet Take 25 mg by mouth as needed for nausea or vomiting.     . sucralfate (CARAFATE) 1 g tablet Take 1 g by mouth 4 (four) times daily -  with meals and at bedtime.      No current facility-administered medications for  this visit.    Allergies as of 08/12/2019 - Review Complete 08/12/2019  Allergen Reaction Noted  . Advil [ibuprofen] Other (See Comments) 11/01/2012  . Nyquil multi-symptom [pseudoeph-doxylamine-dm-apap] Other (See Comments) 04/03/2015    Family History  Problem Relation Age of Onset  . Emphysema Mother   . Stroke Father   . Cirrhosis Sister   . Heart disease Maternal Grandfather   . Diabetes Paternal Grandmother   . Colon cancer Paternal Grandfather   . Emphysema Sister   . Colon cancer Sister   . Emphysema Brother   . Thyroid disease Daughter   . Glaucoma Daughter   . Heart disease Son   . Gout Son   . Hypertension Son     Social History   Socioeconomic History  . Marital status: Married    Spouse name: Not on file  . Number of children: 3  . Years of education: Not on file  . Highest education level: Not on file  Occupational History  . Occupation: RETIRED  Tobacco Use  . Smoking status: Former Smoker    Quit date: 1985    Years since quitting: 36.3  . Smokeless tobacco: Never Used  Substance and Sexual Activity  . Alcohol use: No  . Drug use: No  . Sexual activity: Not Currently  Other Topics Concern  . Not on file  Social History Narrative  . Not on file   Social Determinants of Health   Financial Resource Strain: Low Risk   . Difficulty of Paying Living Expenses: Not very hard  Food Insecurity:   . Worried About Charity fundraiser in the Last Year:   . Arboriculturist in the Last Year:   Transportation Needs: No Transportation Needs  . Lack of Transportation (Medical): No  . Lack of Transportation (Non-Medical): No  Physical Activity:   . Days of Exercise per Week:   . Minutes of Exercise per Session:   Stress: Stress Concern Present  . Feeling of Stress : To some extent  Social Connections:   . Frequency of Communication with Friends and Family:   . Frequency of Social Gatherings with Friends and Family:   . Attends Religious Services:   .  Active Member of Clubs or Organizations:   . Attends Archivist Meetings:   Marland Kitchen Marital Status:   Intimate Partner Violence: Not At Risk  . Fear of Current or Ex-Partner: No  . Emotionally Abused: No  . Physically Abused: No  . Sexually Abused: No    Review of Systems: Gen: see HPI  CV: Denies chest pain, heart palpitations, peripheral edema, syncope.  Resp: +DOE GI: see  HPI GU : Denies urinary burning, urinary frequency, urinary hesitancy MS: Denies joint pain, muscle weakness, cramps, or limitation of movement.  Derm: Denies rash, itching, dry skin Psych: Denies depression, anxiety, memory loss, and confusion Heme: Denies bruising, bleeding, and enlarged lymph nodes.  Physical Exam: BP 99/63   Pulse 77   Temp (!) 96.8 F (36 C) (Temporal)   Ht 6\' 1"  (1.854 m)   Wt 196 lb (88.9 kg)   BMI 25.86 kg/m  General:   Alert and oriented. Pleasant and cooperative. Appears stated age. Somewhat sallow appearing Head:  Normocephalic and atraumatic. Eyes:  Without icterus, sclera clear and conjunctiva pink.  Ears:  Slight hard of hearing Mouth:  Mask in place Lungs:  Clear to auscultation bilaterally.  Heart:  S1, S2 present without murmurs appreciated.  Abdomen:  +BS, soft, non-tender and non-distended. No HSM noted. No guarding or rebound.  Rectal:  Deferred  Msk:  Symmetrical without gross deformities. Normal posture. Extremities:  Without edema. Neurologic:  Alert and  oriented x4 Skin:  Intact without significant lesions or rashes. Psych:  Alert and cooperative. Normal mood and affect.  ASSESSMENT: Richard Conway is a 74 y.o. male recently found to have metastatic disease on imaging, with concern for primary neoplasm of right colon. He has already been evaluated by Oncology, who is requesting diagnostic colonoscopy in an expedited plan to guide treatment. Unfortunately, IR was not able to biopsy liver lesion as was not accessible, and omental nodules not easily  accessible. He has associated abdominal bloating, early satiety nausea, and documented significant weight loss. Constipation onset 6-8 months ago, but he does not have any obstructive signs.   Will start on Miralax daily, add extra day of clear liquids prior to colonoscopy in order to ensure appropriate prep.    PLAN:  Proceed with TCS with Dr. Gala Romney in near future with PROPOFOL: the risks, benefits, and alternatives have been discussed with the patient in detail. The patient states understanding and desires to proceed.  Extra 2 days of clear liquids prior  Miralax daily  Add boost/Ensure for snacks  Call if any changes in interim  Annitta Needs, PhD, ANP-BC [facility]

## 2019-08-13 ENCOUNTER — Encounter: Payer: Self-pay | Admitting: *Deleted

## 2019-08-13 NOTE — Telephone Encounter (Signed)
Called pt. Aware pre-op/covid test scheduled for 5/17 at 8:30am at the [facility] short stay. I have faxed instructions to his pharmacy. Pt aware.

## 2019-08-13 NOTE — Telephone Encounter (Signed)
Called patient and made aware of pre-op/covid test appt. He asked if he could have his procedure done any sooner than jUne 1. Reports he is steadily losing weight.  Called endo and spoke with [facility] Patient can be added on 5/18 at 2:45pm. Called pt back and he was agreeable to this appt. Aware will fax new instructions to his pharmacy. Will call back with new pre-op/covid test.

## 2019-08-17 ENCOUNTER — Other Ambulatory Visit: Payer: Self-pay

## 2019-08-17 ENCOUNTER — Encounter (HOSPITAL_COMMUNITY)
Admission: RE | Admit: 2019-08-17 | Discharge: 2019-08-17 | Disposition: A | Discharge status: Home / Self Care | Payer: Medicare Other | Source: Ambulatory Visit | Attending: Internal Medicine | Admitting: Internal Medicine

## 2019-08-17 ENCOUNTER — Other Ambulatory Visit (HOSPITAL_COMMUNITY)
Admission: RE | Admit: 2019-08-17 | Discharge: 2019-08-17 | Disposition: A | Discharge status: Home / Self Care | Payer: Medicare Other | Source: Ambulatory Visit | Attending: Internal Medicine | Admitting: Internal Medicine

## 2019-08-17 ENCOUNTER — Telehealth: Payer: Self-pay | Admitting: *Deleted

## 2019-08-17 ENCOUNTER — Encounter (HOSPITAL_COMMUNITY): Payer: Self-pay

## 2019-08-17 DIAGNOSIS — Z01812 Encounter for preprocedural laboratory examination: Secondary | ICD-10-CM | POA: Insufficient documentation

## 2019-08-17 DIAGNOSIS — Z20822 Contact with and (suspected) exposure to covid-19: Secondary | ICD-10-CM | POA: Insufficient documentation

## 2019-08-17 LAB — SARS CORONAVIRUS 2 (TAT 6-24 HRS): SARS Coronavirus 2: NEGATIVE

## 2019-08-17 NOTE — Telephone Encounter (Signed)
LMOVM for pt. Endo called and wants to see if patient can move up on schedule for tomorrow.

## 2019-08-17 NOTE — Telephone Encounter (Signed)
Patient returned call. He is fine with moving procedure time up to 1:30pm, aware to arrive at 12:00pm. He is also aware to drink 2nd half prep at 8:30am, npo 10:30am tomorrow morning. He voiced understanding. Called endo and made aware

## 2019-08-18 ENCOUNTER — Ambulatory Visit (HOSPITAL_COMMUNITY): Payer: Medicare Other | Admitting: Anesthesiology

## 2019-08-18 ENCOUNTER — Encounter (HOSPITAL_COMMUNITY)
Admission: RE | Disposition: A | Discharge status: Home / Self Care | Payer: Self-pay | Source: Home / Self Care | Attending: Internal Medicine

## 2019-08-18 ENCOUNTER — Encounter (HOSPITAL_COMMUNITY): Payer: Self-pay | Admitting: Internal Medicine

## 2019-08-18 ENCOUNTER — Ambulatory Visit (HOSPITAL_COMMUNITY)
Admission: RE | Admit: 2019-08-18 | Discharge: 2019-08-18 | Disposition: A | Discharge status: Home / Self Care | Payer: Medicare Other | Attending: Internal Medicine | Admitting: Internal Medicine

## 2019-08-18 DIAGNOSIS — C182 Malignant neoplasm of ascending colon: Secondary | ICD-10-CM | POA: Diagnosis not present

## 2019-08-18 DIAGNOSIS — Z886 Allergy status to analgesic agent status: Secondary | ICD-10-CM | POA: Insufficient documentation

## 2019-08-18 DIAGNOSIS — Z791 Long term (current) use of non-steroidal anti-inflammatories (NSAID): Secondary | ICD-10-CM | POA: Diagnosis not present

## 2019-08-18 DIAGNOSIS — Z7982 Long term (current) use of aspirin: Secondary | ICD-10-CM | POA: Insufficient documentation

## 2019-08-18 DIAGNOSIS — Z888 Allergy status to other drugs, medicaments and biological substances status: Secondary | ICD-10-CM | POA: Insufficient documentation

## 2019-08-18 DIAGNOSIS — M1990 Unspecified osteoarthritis, unspecified site: Secondary | ICD-10-CM | POA: Insufficient documentation

## 2019-08-18 DIAGNOSIS — I2510 Atherosclerotic heart disease of native coronary artery without angina pectoris: Secondary | ICD-10-CM | POA: Diagnosis not present

## 2019-08-18 DIAGNOSIS — Z87891 Personal history of nicotine dependence: Secondary | ICD-10-CM | POA: Diagnosis not present

## 2019-08-18 DIAGNOSIS — Z79899 Other long term (current) drug therapy: Secondary | ICD-10-CM | POA: Diagnosis not present

## 2019-08-18 DIAGNOSIS — K5730 Diverticulosis of large intestine without perforation or abscess without bleeding: Secondary | ICD-10-CM | POA: Insufficient documentation

## 2019-08-18 DIAGNOSIS — R933 Abnormal findings on diagnostic imaging of other parts of digestive tract: Secondary | ICD-10-CM

## 2019-08-18 DIAGNOSIS — I10 Essential (primary) hypertension: Secondary | ICD-10-CM | POA: Insufficient documentation

## 2019-08-18 DIAGNOSIS — Z800 Family history of malignant neoplasm of digestive organs: Secondary | ICD-10-CM | POA: Insufficient documentation

## 2019-08-18 HISTORY — PX: COLONOSCOPY WITH PROPOFOL: SHX5780

## 2019-08-18 HISTORY — PX: BIOPSY: SHX5522

## 2019-08-18 SURGERY — COLONOSCOPY WITH PROPOFOL
Anesthesia: General

## 2019-08-18 MED ORDER — LIDOCAINE 2% (20 MG/ML) 5 ML SYRINGE
INTRAMUSCULAR | Status: AC
  Filled 2019-08-18: qty 5

## 2019-08-18 MED ORDER — PROPOFOL 10 MG/ML IV BOLUS
INTRAVENOUS | Status: AC
  Filled 2019-08-18: qty 40

## 2019-08-18 MED ORDER — PHENYLEPHRINE 40 MCG/ML (10ML) SYRINGE FOR IV PUSH (FOR BLOOD PRESSURE SUPPORT)
PREFILLED_SYRINGE | INTRAVENOUS | Status: AC
  Filled 2019-08-18: qty 10

## 2019-08-18 MED ORDER — EPHEDRINE 5 MG/ML INJ
INTRAVENOUS | Status: AC
  Filled 2019-08-18: qty 10

## 2019-08-18 MED ORDER — PROPOFOL 500 MG/50ML IV EMUL
INTRAVENOUS | Status: DC | PRN
  Administered 2019-08-18 (×2): 20 mg via INTRAVENOUS
  Administered 2019-08-18: 70 mg via INTRAVENOUS
  Administered 2019-08-18: 20 mg via INTRAVENOUS
  Administered 2019-08-18: 30 mg via INTRAVENOUS
  Administered 2019-08-18 (×3): 20 mg via INTRAVENOUS

## 2019-08-18 MED ORDER — LIDOCAINE HCL URETHRAL/MUCOSAL 2 % EX GEL: CUTANEOUS | Status: DC | PRN

## 2019-08-18 MED ORDER — PHENYLEPHRINE HCL (PRESSORS) 10 MG/ML IV SOLN
INTRAVENOUS | Status: DC | PRN
  Administered 2019-08-18: 100 ug via INTRAVENOUS
  Administered 2019-08-18: 50 ug via INTRAVENOUS
  Administered 2019-08-18: 100 ug via INTRAVENOUS

## 2019-08-18 MED ORDER — CHLORHEXIDINE GLUCONATE CLOTH 2 % EX PADS: 6.0000 | MEDICATED_PAD | Freq: Once | CUTANEOUS | Status: DC

## 2019-08-18 MED ORDER — EPHEDRINE SULFATE 50 MG/ML IJ SOLN
INTRAMUSCULAR | Status: DC | PRN
  Administered 2019-08-18: 10 mg via INTRAVENOUS
  Administered 2019-08-18: 5 mg via INTRAVENOUS

## 2019-08-18 MED ORDER — LIDOCAINE HCL (CARDIAC) PF 100 MG/5ML IV SOSY
PREFILLED_SYRINGE | INTRAVENOUS | Status: DC | PRN
  Administered 2019-08-18: 60 mg via INTRAVENOUS

## 2019-08-18 MED ORDER — LACTATED RINGERS IV SOLN: INTRAVENOUS | Status: DC

## 2019-08-18 MED ORDER — STERILE WATER FOR IRRIGATION IR SOLN
Status: DC | PRN
  Administered 2019-08-18: 1.5 mL

## 2019-08-18 NOTE — Interval H&P Note (Signed)
History and Physical Interval Note:  08/18/2019 1:09 PM  Richard Conway  has presented today for surgery, with the diagnosis of colon mass.  The various methods of treatment have been discussed with the patient and family. After consideration of risks, benefits and other options for treatment, the patient has consented to  Procedure(s) with comments: COLONOSCOPY WITH PROPOFOL (N/A) - 2:45pm, ok per [facility] as a surgical intervention.  The patient's history has been reviewed, patient examined, no change in status, stable for surgery.  I have reviewed the patient's chart and labs.  Questions were answered to the patient's satisfaction.     Richard Conway   No change.  Abnormal right colon on PET scan suggestive of metastatic carcinoma.  Patient is here for an urgent diagnostic colonoscopy per plan. The risks, benefits, limitations, alternatives and imponderables have been reviewed with the patient. Questions have been answered. All parties are agreeable.

## 2019-08-18 NOTE — Anesthesia Preprocedure Evaluation (Signed)
Anesthesia Evaluation  Patient identified by MRN, date of birth, ID band Patient awake    Reviewed: Allergy & Precautions, H&P , NPO status , Patient's Chart, lab work & pertinent test results, reviewed documented beta blocker date and time   Airway Mallampati: II  TM Distance: >3 FB Neck ROM: full    Dental no notable dental hx. (+) Edentulous Upper, Edentulous Lower   Pulmonary neg pulmonary ROS, former smoker,    Pulmonary exam normal breath sounds clear to auscultation       Cardiovascular Exercise Tolerance: Good hypertension, + CAD   Rhythm:regular Rate:Normal     Neuro/Psych negative neurological ROS  negative psych ROS   GI/Hepatic negative GI ROS, Neg liver ROS,   Endo/Other  negative endocrine ROS  Renal/GU Renal disease  negative genitourinary   Musculoskeletal   Abdominal   Peds  Hematology negative hematology ROS (+)   Anesthesia Other Findings   Reproductive/Obstetrics negative OB ROS                             Anesthesia Physical Anesthesia Plan  ASA: II  Anesthesia Plan: General   Post-op Pain Management:    Induction:   PONV Risk Score and Plan: 2 and Propofol infusion  Airway Management Planned:   Additional Equipment:   Intra-op Plan:   Post-operative Plan:   Informed Consent: I have reviewed the patients History and Physical, chart, labs and discussed the procedure including the risks, benefits and alternatives for the proposed anesthesia with the patient or authorized representative who has indicated his/her understanding and acceptance.     Dental Advisory Given  Plan Discussed with: CRNA  Anesthesia Plan Comments:         Anesthesia Quick Evaluation

## 2019-08-18 NOTE — Op Note (Signed)
[facility] Patient Name: Richard Conway Procedure Date: 08/18/2019 1:06 PM MRN: [id number] Date of Birth: 12/26/1945 Attending MD: Norvel Richards , MD CSN: [id number] Age: 74 Admit Type: Outpatient Procedure:                Colonoscopy Indications:              Abnormal CT of the GI tract Providers:                Norvel Richards, MD, Charlsie Quest. Theda Sers RN, RN,                            Aram Candela Referring MD:              Medicines:                Propofol per Anesthesia Complications:            No immediate complications. Estimated Blood Loss:     Estimated blood loss was minimal. Procedure:                Pre-Anesthesia Assessment:                           - Prior to the procedure, a History and Physical                            was performed, and patient medications and                            allergies were reviewed. The patient's tolerance of                            previous anesthesia was also reviewed. The risks                            and benefits of the procedure and the sedation                            options and risks were discussed with the patient.                            All questions were answered, and informed consent                            was obtained. Prior Anticoagulants: The patient has                            taken no previous anticoagulant or antiplatelet                            agents. ASA Grade Assessment: IV - A patient with                            severe systemic disease that is a constant threat  to life. After reviewing the risks and benefits,                            the patient was deemed in satisfactory condition to                            undergo the procedure.                           After obtaining informed consent, the colonoscope                            was passed under direct vision. Throughout the                            procedure, the patient's blood  pressure, pulse, and                            oxygen saturations were monitored continuously. The                            [id number] scope was introduced through                            the anus and advanced to the the cecum, identified                            by appendiceal orifice and ileocecal valve. The                            ileocecal valve, appendiceal orifice, and rectum                            were photographed. The entire colon was examined.                            The patient tolerated the procedure well. The                            quality of the bowel preparation was INADEQUATE Scope In: 1:23:17 PM Scope Out: 1:41:15 PM Scope Withdrawal Time: 0 hours 14 minutes 17 seconds  Total Procedure Duration: 0 hours 17 minutes 58 seconds  Findings:      The perianal and digital rectal examinations were normal.      Scattered medium-mouthed diverticula were found in the sigmoid colon and       descending colon. Semilunar /almost circumferential "Apple core" lesion       found in the proximal ascending colon?"just a few centimeters distal to       the ileocecal valve. Please see photos. Lesion was bulky - approximately       4 cm in longitudinal length. Hard to biopsy forcep palpation. Necrotic       and ulcerated in appearance. Multiple biopsies obtained with jumbo       biopsy forceps.      The prep was grossly inadequate  throughout with vegetable matter and       viscous granular stool present which precluded complete survey of all of       the colonic and rectal mucosa. A small or flat lesion may have been       obscured by the poor prep. Impression:               - Preparation of the colon was inadequate.                           - Diverticulosis in the sigmoid colon and in the                            descending colon.                           -Bulky, necrotic appearing tumor in the ascending                            colon as described  above?"status post biopsy.                           Inadequate preparation did compromise examination                            as described. Moderate Sedation:      Moderate (conscious) sedation was personally administered by an       anesthesia professional. The following parameters were monitored: oxygen       saturation, heart rate, blood pressure, respiratory rate, EKG, adequacy       of pulmonary ventilation, and response to care. Recommendation:           - Patient has a contact number available for                            emergencies. The signs and symptoms of potential                            delayed complications were discussed with the                            patient. Return to normal activities tomorrow.                            Written discharge instructions were provided to the                            patient.                           - Advance diet as tolerated. Follow-up on                            pathology. Return to see the medical oncologist. Procedure Code(s):        --- Professional ---  45378, Colonoscopy, flexible; diagnostic, including                            collection of specimen(s) by brushing or washing,                            when performed (separate procedure) Diagnosis Code(s):        --- Professional ---                           K57.30, Diverticulosis of large intestine without                            perforation or abscess without bleeding                           R93.3, Abnormal findings on diagnostic imaging of                            other parts of digestive tract CPT copyright 2019 American Medical Association. All rights reserved. The codes documented in this report are preliminary and upon coder review may  be revised to meet current compliance requirements. Cristopher Estimable. Vaibhav Fogleman, MD Norvel Richards, MD 08/18/2019 1:57:37 PM This report has been signed electronically. Number of Addenda:  0

## 2019-08-18 NOTE — Discharge Instructions (Signed)
Monitored Anesthesia Care, Care After These instructions provide you with information about caring for yourself after your procedure. Your health care provider may also give you more specific instructions. Your treatment has been planned according to current medical practices, but problems sometimes occur. Call your health care provider if you have any problems or questions after your procedure. What can I expect after the procedure? After your procedure, you may:  Feel sleepy for several hours.  Feel clumsy and have poor balance for several hours.  Feel forgetful about what happened after the procedure.  Have poor judgment for several hours.  Feel nauseous or vomit.  Have a sore throat if you had a breathing tube during the procedure. Follow these instructions at home: For at least 24 hours after the procedure:      Have a responsible adult stay with you. It is important to have someone help care for you until you are awake and alert.  Rest as needed.  Do not: ? Participate in activities in which you could fall or become injured. ? Drive. ? Use heavy machinery. ? Drink alcohol. ? Take sleeping pills or medicines that cause drowsiness. ? Make important decisions or sign legal documents. ? Take care of children on your own. Eating and drinking  Follow the diet that is recommended by your health care provider.  If you vomit, drink water, juice, or soup when you can drink without vomiting.  Make sure you have little or no nausea before eating solid foods. General instructions  Take over-the-counter and prescription medicines only as told by your health care provider.  If you have sleep apnea, surgery and certain medicines can increase your risk for breathing problems. Follow instructions from your health care provider about wearing your sleep device: ? Anytime you are sleeping, including during daytime naps. ? While taking prescription pain medicines, sleeping medicines,  or medicines that make you drowsy.  If you smoke, do not smoke without supervision.  Keep all follow-up visits as told by your health care provider. This is important. Contact a health care provider if:  You keep feeling nauseous or you keep vomiting.  You feel light-headed.  You develop a rash.  You have a fever. Get help right away if:  You have trouble breathing. Summary  For several hours after your procedure, you may feel sleepy and have poor judgment.  Have a responsible adult stay with you for at least 24 hours or until you are awake and alert. This information is not intended to replace advice given to you by your health care provider. Make sure you discuss any questions you have with your health care provider. Document Revised: 06/17/2017 Document Reviewed: 07/10/2015 Elsevier Patient Education  [place].    Colonoscopy Discharge Instructions  Read the instructions outlined below and refer to this sheet in the next few weeks. These discharge instructions provide you with general information on caring for yourself after you leave the hospital. Your doctor may also give you specific instructions. While your treatment has been planned according to the most current medical practices available, unavoidable complications occasionally occur. If you have any problems or questions after discharge, call Dr. Gala Romney at [phone]. ACTIVITY  You may resume your regular activity, but move at a slower pace for the next 24 hours.   Take frequent rest periods for the next 24 hours.   Walking will help get rid of the air and reduce the bloated feeling in your belly (abdomen).   No driving  for 24 hours (because of the medicine (anesthesia) used during the test).    Do not sign any important legal documents or operate any machinery for 24 hours (because of the anesthesia used during the test).  NUTRITION  Drink plenty of fluids.   You may resume your normal diet as  instructed by your doctor.   Begin with a light meal and progress to your normal diet. Heavy or fried foods are harder to digest and may make you feel sick to your stomach (nauseated).   Avoid alcoholic beverages for 24 hours or as instructed.  MEDICATIONS  You may resume your normal medications unless your doctor tells you otherwise.  WHAT YOU CAN EXPECT TODAY  Some feelings of bloating in the abdomen.   Passage of more gas than usual.   Spotting of blood in your stool or on the toilet paper.  IF YOU HAD POLYPS REMOVED DURING THE COLONOSCOPY:  No aspirin products for 7 days or as instructed.   No alcohol for 7 days or as instructed.   Eat a soft diet for the next 24 hours.  FINDING OUT THE RESULTS OF YOUR TEST Not all test results are available during your visit. If your test results are not back during the visit, make an appointment with your caregiver to find out the results. Do not assume everything is normal if you have not heard from your caregiver or the medical facility. It is important for you to follow up on all of your test results.  SEEK IMMEDIATE MEDICAL ATTENTION IF:  You have more than a spotting of blood in your stool.   Your belly is swollen (abdominal distention).   You are nauseated or vomiting.   You have a temperature over 101.   You have abdominal pain or discomfort that is severe or gets worse throughout the day.   Your colon preparation was not very good today.  However, I was able to complete the examination.  I found the lesion in the right side of your colon.  Good biopsies were taken.  Further recommendations to follow pending review of pathology report  At patient request I called Richard Conway, daughter, [phone] findings.

## 2019-08-18 NOTE — Transfer of Care (Signed)
Immediate Anesthesia Transfer of Care Note  Patient: Richard [place]  Procedure(s) Performed: COLONOSCOPY WITH PROPOFOL (N/A ) BIOPSY  Patient Location: PACU  Anesthesia Type:MAC  Level of Consciousness: awake, alert  and oriented  Airway & Oxygen Therapy: Patient Spontanous Breathing and Patient connected to nasal cannula oxygen  Post-op Assessment: Report given to RN and Post -op Vital signs reviewed and stable  Post vital signs: Reviewed  Last Vitals:  Vitals Value Taken Time  BP 94/55 08/18/19 1348  Temp    Pulse 73 08/18/19 1350  Resp 25 08/18/19 1350  SpO2 93 % 08/18/19 1350  Vitals shown include unvalidated device data.  Last Pain:  Vitals:   08/18/19 1252  TempSrc: Axillary  PainSc: 0-No pain         Complications: No apparent anesthesia complications

## 2019-08-19 NOTE — Anesthesia Postprocedure Evaluation (Signed)
Anesthesia Post Note  Patient: Richard Conway  Procedure(s) Performed: COLONOSCOPY WITH PROPOFOL (N/A ) BIOPSY  Patient location during evaluation: PACU Anesthesia Type: General Level of consciousness: awake and alert and oriented Pain management: pain level controlled Vital Signs Assessment: post-procedure vital signs reviewed and stable Respiratory status: spontaneous breathing Cardiovascular status: blood pressure returned to baseline and stable Postop Assessment: no apparent nausea or vomiting Anesthetic complications: no Comments: Late entry     Last Vitals:  Vitals:   08/18/19 1400 08/18/19 1417  BP: (!) 103/49 104/71  Pulse: 70 73  Resp: 12 12  Temp:  36.4 C  SpO2: 94% 97%    Last Pain:  Vitals:   08/18/19 1417  TempSrc: Axillary  PainSc: 0-No pain                 Cayenne Breault

## 2019-08-20 LAB — SURGICAL PATHOLOGY

## 2019-08-24 ENCOUNTER — Other Ambulatory Visit: Payer: Self-pay

## 2019-08-24 ENCOUNTER — Inpatient Hospital Stay (HOSPITAL_BASED_OUTPATIENT_CLINIC_OR_DEPARTMENT_OTHER): Payer: Medicare Other | Admitting: Hematology

## 2019-08-24 VITALS — BP 81/51 | HR 95 | Temp 96.9°F | Resp 19 | Wt 192.3 lb

## 2019-08-24 DIAGNOSIS — R970 Elevated carcinoembryonic antigen [CEA]: Secondary | ICD-10-CM | POA: Diagnosis not present

## 2019-08-24 DIAGNOSIS — Z800 Family history of malignant neoplasm of digestive organs: Secondary | ICD-10-CM | POA: Diagnosis not present

## 2019-08-24 DIAGNOSIS — Z87891 Personal history of nicotine dependence: Secondary | ICD-10-CM | POA: Diagnosis not present

## 2019-08-24 DIAGNOSIS — C778 Secondary and unspecified malignant neoplasm of lymph nodes of multiple regions: Secondary | ICD-10-CM | POA: Diagnosis not present

## 2019-08-24 DIAGNOSIS — C771 Secondary and unspecified malignant neoplasm of intrathoracic lymph nodes: Secondary | ICD-10-CM

## 2019-08-24 DIAGNOSIS — K5900 Constipation, unspecified: Secondary | ICD-10-CM | POA: Diagnosis not present

## 2019-08-24 DIAGNOSIS — C7800 Secondary malignant neoplasm of unspecified lung: Secondary | ICD-10-CM | POA: Diagnosis not present

## 2019-08-24 DIAGNOSIS — R0602 Shortness of breath: Secondary | ICD-10-CM | POA: Diagnosis not present

## 2019-08-24 DIAGNOSIS — R002 Palpitations: Secondary | ICD-10-CM | POA: Diagnosis not present

## 2019-08-24 DIAGNOSIS — C7972 Secondary malignant neoplasm of left adrenal gland: Secondary | ICD-10-CM | POA: Diagnosis not present

## 2019-08-24 DIAGNOSIS — C189 Malignant neoplasm of colon, unspecified: Secondary | ICD-10-CM | POA: Diagnosis not present

## 2019-08-24 DIAGNOSIS — C7971 Secondary malignant neoplasm of right adrenal gland: Secondary | ICD-10-CM | POA: Diagnosis not present

## 2019-08-24 DIAGNOSIS — C7951 Secondary malignant neoplasm of bone: Secondary | ICD-10-CM | POA: Diagnosis not present

## 2019-08-24 DIAGNOSIS — C801 Malignant (primary) neoplasm, unspecified: Secondary | ICD-10-CM | POA: Diagnosis not present

## 2019-08-24 DIAGNOSIS — C787 Secondary malignant neoplasm of liver and intrahepatic bile duct: Secondary | ICD-10-CM | POA: Diagnosis not present

## 2019-08-24 MED ORDER — PROCHLORPERAZINE MALEATE 10 MG PO TABS: 10.0000 mg | ORAL_TABLET | Freq: Four times a day (QID) | ORAL | 2 refills | Status: DC | PRN

## 2019-08-24 MED ORDER — ESCITALOPRAM OXALATE 10 MG PO TABS
10.0000 mg | ORAL_TABLET | Freq: Every day | ORAL | 1 refills | Status: AC
Start: 2019-08-24 — End: ?

## 2019-08-24 MED ORDER — LACTULOSE 20 GM/30ML PO SOLN: 30.0000 mL | Freq: Every day | ORAL | 3 refills | Status: AC

## 2019-08-24 NOTE — Patient Instructions (Signed)
[place] at [facility] Discharge Instructions  You were seen today by Dr. Delton Coombes. He went over your recent results. You have colon cancer, this can not be cured due to the extent of your cancer. You have stage 4 colon cancer, you will need Chemotherapy, with Chemotherapy you may have a couple of years, if you chose not to take the chemotherapy you may have 6 months or less. He discussed your treatment options and also discussed Hospice and palliative care. He will get you scheduled for a port-a-cath placement. He will see you back after your port placement for labs and follow up.  STOP taking Lisinopril- HCTZ and they low dose aspirin. STOP taking Benazepril. Continue taking the Metoprolol.  Thank you for choosing [place] [facility] to provide your oncology and hematology care.  To afford each patient quality time with our provider, please arrive at least 15 minutes before your scheduled appointment time.   If you have a lab appointment with the [facility] please come in thru the  Main Entrance and check in at the main information desk  You need to re-schedule your appointment should you arrive 10 or more minutes late.  We strive to give you quality time with our providers, and arriving late affects you and other patients whose appointments are after yours.  Also, if you no show three or more times for appointments you may be dismissed from the clinic at the providers discretion.     Again, thank you for choosing [facility].  Our hope is that these requests will decrease the amount of time that you wait before being seen by our physicians.       _____________________________________________________________  Should you have questions after your visit to [facility], please contact our office at [phone] between the hours of 8:00 a.m. and 4:30 p.m.  Voicemails left after 4:00 p.m. will not be returned until  the following business day.  For prescription refill requests, have your pharmacy contact our office and allow 72 hours.    Cancer Center Support Programs:   > Cancer Support Group  2nd Tuesday of the month 1pm-2pm, Journey Room

## 2019-08-24 NOTE — Progress Notes (Signed)
Richard [place], Richard Conway [id number]   CLINIC:  Medical Oncology/Hematology  PCP:  Celene Squibb, MD [address] [place] [address] [phone]   REASON FOR VISIT:  Metastatic colon cancer.   CURRENT THERAPY: Pending port placement.  BRIEF ONCOLOGIC HISTORY:  Oncology History  Colon cancer metastasized to multiple sites [facility])  08/24/2019 Initial Diagnosis   Colon cancer metastasized to multiple sites [facility])   08/24/2019 Cancer Staging   Staging form: Colon and Rectum, AJCC 8th Edition - Clinical stage from 08/24/2019: Stage IVC (cTX, cNX, cM1c) - Signed by Derek Jack, MD on 08/24/2019     CANCER STAGING: Cancer Staging Colon cancer metastasized to multiple sites [facility]) Staging form: Colon and Rectum, AJCC 8th Edition - Clinical stage from 08/24/2019: Stage IVC (cTX, cNX, cM1c) - Signed by Derek Jack, MD on 08/24/2019    INTERVAL HISTORY:  Richard [place] 74 y.o. male seen for follow-up after colonoscopy and biopsy.  His wife, daughter and son present with him in the room.  Reports appetite and energy levels low at 25%.  Reports dizziness when standing up.  Also has some constipation.  Occasional nausea present.  Also reports some sleep problems.  Had colonoscopy and biopsy recently.  REVIEW OF SYSTEMS:  Review of Systems  Respiratory: Positive for shortness of breath.   Gastrointestinal: Positive for constipation and nausea.  Neurological: Positive for dizziness.  All other systems reviewed and are negative.    PAST MEDICAL/SURGICAL HISTORY:  Past Medical History:  Diagnosis Date  . Arthritis   . CAD (coronary artery disease)   . Hypertension   . Renal disorder    kidney stones   Past Surgical History:  Procedure Laterality Date  . CHOLECYSTECTOMY    . KNEE SURGERY    . LITHOTRIPSY       SOCIAL HISTORY:  Social History   Socioeconomic History  . Marital status: Married    Spouse name: Not on file    . Number of children: 3  . Years of education: Not on file  . Highest education level: Not on file  Occupational History  . Occupation: RETIRED  Tobacco Use  . Smoking status: Former Smoker    Quit date: 1985    Years since quitting: 36.4  . Smokeless tobacco: Never Used  Substance and Sexual Activity  . Alcohol use: No  . Drug use: No  . Sexual activity: Not Currently  Other Topics Concern  . Not on file  Social History Narrative  . Not on file   Social Determinants of Health   Financial Resource Strain: Low Risk   . Difficulty of Paying Living Expenses: Not very hard  Food Insecurity:   . Worried About Charity fundraiser in the Last Year:   . Arboriculturist in the Last Year:   Transportation Needs: No Transportation Needs  . Lack of Transportation (Medical): No  . Lack of Transportation (Non-Medical): No  Physical Activity:   . Days of Exercise per Week:   . Minutes of Exercise per Session:   Stress: Stress Concern Present  . Feeling of Stress : To some extent  Social Connections:   . Frequency of Communication with Friends and Family:   . Frequency of Social Gatherings with Friends and Family:   . Attends Religious Services:   . Active Member of Clubs or Organizations:   . Attends Archivist Meetings:   Marland Kitchen Marital Status:  Intimate Partner Violence: Not At Risk  . Fear of Current or Ex-Partner: No  . Emotionally Abused: No  . Physically Abused: No  . Sexually Abused: No    FAMILY HISTORY:  Family History  Problem Relation Age of Onset  . Emphysema Mother   . Stroke Father   . Cirrhosis Sister   . Heart disease Maternal Grandfather   . Diabetes Paternal Grandmother   . Colon cancer Paternal Grandfather   . Emphysema Sister   . Colon cancer Sister   . Emphysema Brother   . Thyroid disease Daughter   . Glaucoma Daughter   . Heart disease Son   . Gout Son   . Hypertension Son     CURRENT MEDICATIONS:  Outpatient Encounter Medications as  of 08/24/2019  Medication Sig  . aspirin EC 81 MG tablet Take 1 tablet (81 mg total) by mouth daily.  Marland Kitchen atorvastatin (LIPITOR) 40 MG tablet Take 1 tablet (40 mg total) by mouth daily.  . benazepril (LOTENSIN) 10 MG tablet Take 10 mg by mouth daily.  . Cyanocobalamin (B-12 PO) Take by mouth daily.  Marland Kitchen gabapentin (NEURONTIN) 300 MG capsule Take 300 mg by mouth 3 (three) times daily.  . Glucosamine HCl (GLUCOSAMINE PO) Take 1 tablet by mouth daily.  Marland Kitchen HYDROcodone-acetaminophen (NORCO/VICODIN) 5-325 MG tablet Take 1 tablet by mouth 2 (two) times daily as needed for moderate pain.   Marland Kitchen lisinopril-hydrochlorothiazide (ZESTORETIC) 10-12.5 MG tablet Take 1 tablet by mouth daily.  Marland Kitchen LORazepam (ATIVAN) 0.5 MG tablet Take 0.5 mg by mouth at bedtime.  . meloxicam (MOBIC) 15 MG tablet Take 15 mg by mouth daily.   . metoprolol tartrate (LOPRESSOR) 25 MG tablet Take 1 tablet (25 mg total) by mouth 2 (two) times daily.  . Multiple Vitamin (MULTIVITAMIN WITH MINERALS) TABS tablet Take 1 tablet by mouth daily.  . Omega-3 Fatty Acids (FISH OIL) 500 MG CAPS Take 1 capsule by mouth daily.   . pantoprazole (PROTONIX) 40 MG tablet Take 40 mg by mouth daily.  . sucralfate (CARAFATE) 1 g tablet Take 1 g by mouth 4 (four) times daily -  with meals and at bedtime.   Marland Kitchen acetaminophen (TYLENOL) 325 MG tablet Take 650 mg by mouth as needed for mild pain.   Marland Kitchen escitalopram (LEXAPRO) 10 MG tablet Take 1 tablet (10 mg total) by mouth daily.  . Lactulose 20 GM/30ML SOLN Take 30 mLs (20 g total) by mouth at bedtime.  . ondansetron (ZOFRAN) 8 MG tablet Take 8 mg by mouth as needed for nausea or vomiting.   . prochlorperazine (COMPAZINE) 10 MG tablet Take 1 tablet (10 mg total) by mouth every 6 (six) hours as needed for nausea or vomiting.  . promethazine (PHENERGAN) 25 MG tablet Take 25 mg by mouth as needed for nausea or vomiting.    No facility-administered encounter medications on file as of 08/24/2019.    ALLERGIES:    Allergies  Allergen Reactions  . Advil [Ibuprofen] Other (See Comments)    Face and body numbness  . Nyquil Multi-Symptom [Pseudoeph-Doxylamine-Dm-Apap] Other (See Comments)    Increase in bp   . Sulfa Antibiotics Nausea And Vomiting     PHYSICAL EXAM:  ECOG Performance status: 1  Vitals:   08/24/19 1618  BP: (!) 81/51  Pulse: 95  Resp: 19  Temp: (!) 96.9 F (36.1 C)  SpO2: 100%   Filed Weights   08/24/19 1618  Weight: 192 lb 4.8 oz (87.2 kg)   Physical Exam Vitals  reviewed.  Constitutional:      Appearance: Normal appearance.  Pulmonary:     Effort: Pulmonary effort is normal.     Breath sounds: Normal breath sounds.  Neurological:     General: No focal deficit present.     Mental Status: He is alert and oriented to person, place, and time.  Psychiatric:        Mood and Affect: Mood normal.        Behavior: Behavior normal.      LABORATORY DATA:  I have reviewed the labs as listed.  CBC    Component Value Date/Time   WBC 12.8 (H) 07/06/2019 1823   RBC 4.19 (L) 07/06/2019 1823   HGB 11.9 (L) 07/06/2019 1823   HCT 38.1 (L) 07/06/2019 1823   PLT 554 (H) 07/06/2019 1823   MCV 90.9 07/06/2019 1823   MCH 28.4 07/06/2019 1823   MCHC 31.2 07/06/2019 1823   RDW 14.4 07/06/2019 1823   LYMPHSABS 1.8 07/06/2019 1823   MONOABS 1.0 07/06/2019 1823   EOSABS 0.3 07/06/2019 1823   BASOSABS 0.0 07/06/2019 1823   CMP Latest Ref Rng & Units 07/06/2019 06/15/2019 11/01/2012  Glucose 70 - 99 mg/dL 118(H) 140(H) 117(H)  BUN 8 - 23 mg/dL _0 Creatinine 0.61 - 1.24 mg/dL 1.09 1.02 0.67  Sodium 135 - 145 mmol/L 132(L) 134(L) 138  Potassium 3.5 - 5.1 mmol/L 4.1 3.8 3.6  Chloride 98 - 111 mmol/L 95(L) 101 102  CO2 22 - 32 mmol/L _1 Calcium 8.9 - 10.3 mg/dL 8.9 8.6(L) 9.3  Total Protein 6.5 - 8.1 g/dL 8.1 7.4 -  Total Bilirubin 0.3 - 1.2 mg/dL 0.3 0.4 -  Alkaline Phos 38 - 126 U/L 69 75 -  AST 15 - 41 U/L 30 25 -  ALT 0 - 44 U/L 22 20 -    DIAGNOSTIC  IMAGING:  I have independently reviewed the scans.  ASSESSMENT & PLAN:  Colon cancer metastasized to multiple sites ([place]) 1.  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  MMR is preserved. -PET scan on 07/27/2019 showed widespread hypermetabolic metastatic disease in the chest, abdomen and pelvis including mediastinal, right hilar adenopathy, bilateral subpleural lung nodules, hepatic lesion, right adrenal uptake, abdominal lymphadenopathy, omental nodularity and bone metastasis. -CEA is mildly elevated at 9.5. -We had a prolonged discussion about the normal prognosis of widely metastatic colon cancer with survival measured in few months.  We have also talked about median survival with chemotherapy of about 2 years.  We talked about best supportive care in the form of hospice. -Patient would like to try chemotherapy and if he tolerates well would like to continue it.  Otherwise he would stop treatments at that point. -We talked about some of the side effects of chemotherapy.  He will need port placement.  We will likely start him on FOLFOX based chemotherapy. -We will also consider sending for NGS testing.  2.  Hypotension: -Blood pressure today is 81/51.  He reports dizziness. -We will discontinue benazepril and lisinopril/HCTZ and aspirin 81 mg. -He will continue metoprolol 25 mg twice daily at this time.  If the blood pressure continues to be low, we will also consider discontinuing it.  3.  Depression: -We will start him on Lexapro 10 mg daily and titrate up as needed.  4.  Constipation: -We will start him on lactulose 30 mL at  bedtime.  5.  Family history: -Sister died of colon cancer at age 24.  Paternal grandfather had colon cancer. -We will consider genetic testing.     Orders placed this encounter:  No orders of the defined types were  placed in this encounter.  Total time spent is 40 minutes with more than 50% of the time spent face-to-face discussing scan results, biopsy results, prognosis, treatment plan, counseling and coordination of care.   Derek Jack, MD [place] [phone]

## 2019-08-24 NOTE — Assessment & Plan Note (Signed)
1.  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  MMR is preserved. -PET scan on 07/27/2019 showed widespread hypermetabolic metastatic disease in the chest, abdomen and pelvis including mediastinal, right hilar adenopathy, bilateral subpleural lung nodules, hepatic lesion, right adrenal uptake, abdominal lymphadenopathy, omental nodularity and bone metastasis. -CEA is mildly elevated at 9.5. -We had a prolonged discussion about the normal prognosis of widely metastatic colon cancer with survival measured in few months.  We have also talked about median survival with chemotherapy of about 2 years.  We talked about best supportive care in the form of hospice. -Patient would like to try chemotherapy and if he tolerates well would like to continue it.  Otherwise he would stop treatments at that point. -We talked about some of the side effects of chemotherapy.  He will need port placement.  We will likely start him on FOLFOX based chemotherapy. -We will also consider sending for NGS testing.  2.  Hypotension: -Blood pressure today is 81/51.  He reports dizziness. -We will discontinue benazepril and lisinopril/HCTZ and aspirin 81 mg. -He will continue metoprolol 25 mg twice daily at this time.  If the blood pressure continues to be low, we will also consider discontinuing it.  3.  Depression: -We will start him on Lexapro 10 mg daily and titrate up as needed.  4.  Constipation: -We will start him on lactulose 30 mL at bedtime.  5.  Family history: -Sister died of colon cancer at age 66.  Paternal grandfather had colon cancer. -We will consider genetic testing.

## 2019-08-25 ENCOUNTER — Other Ambulatory Visit (HOSPITAL_COMMUNITY): Payer: Self-pay | Admitting: *Deleted

## 2019-08-25 DIAGNOSIS — C189 Malignant neoplasm of colon, unspecified: Secondary | ICD-10-CM

## 2019-08-26 ENCOUNTER — Other Ambulatory Visit: Payer: Self-pay | Admitting: Radiology

## 2019-08-27 ENCOUNTER — Ambulatory Visit (HOSPITAL_COMMUNITY)
Admission: RE | Admit: 2019-08-27 | Discharge: 2019-08-27 | Disposition: A | Discharge status: Home / Self Care | Payer: Medicare Other | Source: Ambulatory Visit | Attending: Hematology | Admitting: Hematology

## 2019-08-27 ENCOUNTER — Encounter (HOSPITAL_COMMUNITY): Payer: Self-pay

## 2019-08-27 ENCOUNTER — Other Ambulatory Visit: Payer: Self-pay

## 2019-08-27 DIAGNOSIS — I10 Essential (primary) hypertension: Secondary | ICD-10-CM | POA: Diagnosis not present

## 2019-08-27 DIAGNOSIS — Z79899 Other long term (current) drug therapy: Secondary | ICD-10-CM | POA: Diagnosis not present

## 2019-08-27 DIAGNOSIS — I2510 Atherosclerotic heart disease of native coronary artery without angina pectoris: Secondary | ICD-10-CM | POA: Insufficient documentation

## 2019-08-27 DIAGNOSIS — Z882 Allergy status to sulfonamides status: Secondary | ICD-10-CM | POA: Insufficient documentation

## 2019-08-27 DIAGNOSIS — M1990 Unspecified osteoarthritis, unspecified site: Secondary | ICD-10-CM | POA: Diagnosis not present

## 2019-08-27 DIAGNOSIS — Z7982 Long term (current) use of aspirin: Secondary | ICD-10-CM | POA: Insufficient documentation

## 2019-08-27 DIAGNOSIS — Z5111 Encounter for antineoplastic chemotherapy: Secondary | ICD-10-CM | POA: Diagnosis not present

## 2019-08-27 DIAGNOSIS — Z888 Allergy status to other drugs, medicaments and biological substances status: Secondary | ICD-10-CM | POA: Diagnosis not present

## 2019-08-27 DIAGNOSIS — Z87891 Personal history of nicotine dependence: Secondary | ICD-10-CM | POA: Diagnosis not present

## 2019-08-27 DIAGNOSIS — C799 Secondary malignant neoplasm of unspecified site: Secondary | ICD-10-CM | POA: Diagnosis not present

## 2019-08-27 DIAGNOSIS — C189 Malignant neoplasm of colon, unspecified: Secondary | ICD-10-CM | POA: Insufficient documentation

## 2019-08-27 DIAGNOSIS — Z886 Allergy status to analgesic agent status: Secondary | ICD-10-CM | POA: Insufficient documentation

## 2019-08-27 DIAGNOSIS — Z8249 Family history of ischemic heart disease and other diseases of the circulatory system: Secondary | ICD-10-CM | POA: Insufficient documentation

## 2019-08-27 HISTORY — PX: IR IMAGING GUIDED PORT INSERTION: IMG5740

## 2019-08-27 LAB — CBC WITH DIFFERENTIAL/PLATELET
Abs Immature Granulocytes: 0.06 10*3/uL (ref 0.00–0.07)
Basophils Absolute: 0 10*3/uL (ref 0.0–0.1)
Basophils Relative: 0 %
Eosinophils Absolute: 0.1 10*3/uL (ref 0.0–0.5)
Eosinophils Relative: 1 %
HCT: 31.6 % — ABNORMAL LOW (ref 39.0–52.0)
Hemoglobin: 9.9 g/dL — ABNORMAL LOW (ref 13.0–17.0)
Immature Granulocytes: 1 %
Lymphocytes Relative: 15 %
Lymphs Abs: 1.6 10*3/uL (ref 0.7–4.0)
MCH: 27.7 pg (ref 26.0–34.0)
MCHC: 31.3 g/dL (ref 30.0–36.0)
MCV: 88.3 fL (ref 80.0–100.0)
Monocytes Absolute: 0.8 10*3/uL (ref 0.1–1.0)
Monocytes Relative: 7 %
Neutro Abs: 8.3 10*3/uL — ABNORMAL HIGH (ref 1.7–7.7)
Neutrophils Relative %: 76 %
Platelets: 582 10*3/uL — ABNORMAL HIGH (ref 150–400)
RBC: 3.58 MIL/uL — ABNORMAL LOW (ref 4.22–5.81)
RDW: 15.5 % (ref 11.5–15.5)
WBC: 10.9 10*3/uL — ABNORMAL HIGH (ref 4.0–10.5)
nRBC: 0 % (ref 0.0–0.2)

## 2019-08-27 MED ORDER — HEPARIN SOD (PORK) LOCK FLUSH 100 UNIT/ML IV SOLN
INTRAVENOUS | Status: AC | PRN
  Administered 2019-08-27: 500 [IU] via INTRAVENOUS

## 2019-08-27 MED ORDER — MIDAZOLAM HCL 2 MG/2ML IJ SOLN
INTRAMUSCULAR | Status: AC
  Filled 2019-08-27: qty 4

## 2019-08-27 MED ORDER — HEPARIN SOD (PORK) LOCK FLUSH 100 UNIT/ML IV SOLN
INTRAVENOUS | Status: AC
  Filled 2019-08-27: qty 5

## 2019-08-27 MED ORDER — LIDOCAINE-EPINEPHRINE 1 %-1:100000 IJ SOLN
INTRAMUSCULAR | Status: AC
  Filled 2019-08-27: qty 1

## 2019-08-27 MED ORDER — MIDAZOLAM HCL 2 MG/2ML IJ SOLN
INTRAMUSCULAR | Status: AC | PRN
  Administered 2019-08-27: 1 mg via INTRAVENOUS

## 2019-08-27 MED ORDER — FENTANYL CITRATE (PF) 100 MCG/2ML IJ SOLN
INTRAMUSCULAR | Status: AC
  Filled 2019-08-27: qty 2

## 2019-08-27 MED ORDER — CEFAZOLIN SODIUM-DEXTROSE 2-4 GM/100ML-% IV SOLN: 2.0000 g | INTRAVENOUS | Status: AC

## 2019-08-27 MED ORDER — CEFAZOLIN SODIUM-DEXTROSE 2-4 GM/100ML-% IV SOLN
INTRAVENOUS | Status: AC
  Administered 2019-08-27: 2 g via INTRAVENOUS
  Filled 2019-08-27: qty 100

## 2019-08-27 MED ORDER — FENTANYL CITRATE (PF) 100 MCG/2ML IJ SOLN
INTRAMUSCULAR | Status: AC | PRN
  Administered 2019-08-27: 50 ug via INTRAVENOUS

## 2019-08-27 MED ORDER — SODIUM CHLORIDE 0.9 % IV SOLN: INTRAVENOUS | Status: DC

## 2019-08-27 MED ORDER — LIDOCAINE-EPINEPHRINE (PF) 1 %-1:200000 IJ SOLN
INTRAMUSCULAR | Status: AC | PRN
  Administered 2019-08-27: 10 mL

## 2019-08-27 NOTE — Discharge Instructions (Signed)

## 2019-08-27 NOTE — H&P (Signed)
Chief Complaint: Patient was seen in consultation today for a port-a-cath.   Referring Physician(s): Katragadda,Sreedhar  Supervising Physician: Sandi Mariscal  Patient Status: [facility] - Out-pt  History of Present Illness: Richard Conway is a 74 y.o. male with a medical history of CAD, hypertension and kidney stones. He has recently been diagnosed with Stage IVC metastatic colon cancer. He presented to the ED March 2021 with chest pain. CT chest showed mediastinal adenopathy and bilateral lung nodules. CT abdomen/pelvis in April 2021 showed soft tissue mass within the posterior aspect of gastric fundus, numerous tiny soft tissue nodules within mesentery of mid pelvis, mid to upper abdomen on left and posterior to junction of descending colon and sigmoid.    The patient has since had a colonoscopy which showed an apple core lesion in the proximal ascending colon, biopsy consistent with adenocarcinoma. A liver lesion biopsy done by Dr. Annamaria Boots 08/04/2019 demonstrated the same diagnosis.   Interventional Radiology has been asked to evaluate this patient for the placement of a port-a-cath to facilitate his cancer treatment plan.    Past Medical History:  Diagnosis Date  . Arthritis   . CAD (coronary artery disease)   . Hypertension   . Renal disorder    kidney stones    Past Surgical History:  Procedure Laterality Date  . BIOPSY  08/18/2019   Procedure: BIOPSY;  Surgeon: Daneil Dolin, MD;  Location: [facility];  Service: Endoscopy;;  . CHOLECYSTECTOMY    . COLONOSCOPY WITH PROPOFOL N/A 08/18/2019   Procedure: COLONOSCOPY WITH PROPOFOL;  Surgeon: Daneil Dolin, MD;  Location: [facility];  Service: Endoscopy;  Laterality: N/A;  2:45pm, ok per Threasa Beards  . KNEE SURGERY    . LITHOTRIPSY      Allergies: Advil [ibuprofen], Nyquil multi-symptom [pseudoeph-doxylamine-dm-apap], and Sulfa antibiotics  Medications: Prior to Admission medications   Medication Sig Start Date End Date  Taking? Authorizing Provider  acetaminophen (TYLENOL) 325 MG tablet Take 650 mg by mouth as needed for mild pain.     [provider]  aspirin EC 81 MG tablet Take 1 tablet (81 mg total) by mouth daily. 07/13/19   Herminio Commons, MD  atorvastatin (LIPITOR) 40 MG tablet Take 1 tablet (40 mg total) by mouth daily. 07/13/19 10/11/19  Herminio Commons, MD  benazepril (LOTENSIN) 10 MG tablet Take 10 mg by mouth daily.    [provider]  Cyanocobalamin (B-12 PO) Take by mouth daily.    [provider]  escitalopram (LEXAPRO) 10 MG tablet Take 1 tablet (10 mg total) by mouth daily. 08/24/19   Derek Jack, MD  gabapentin (NEURONTIN) 300 MG capsule Take 300 mg by mouth 3 (three) times daily. 04/27/19   [provider]  Glucosamine HCl (GLUCOSAMINE PO) Take 1 tablet by mouth daily.    [provider]  HYDROcodone-acetaminophen (NORCO/VICODIN) 5-325 MG tablet Take 1 tablet by mouth 2 (two) times daily as needed for moderate pain.  05/21/19   [provider]  Lactulose 20 GM/30ML SOLN Take 30 mLs (20 g total) by mouth at bedtime. 08/24/19   Derek Jack, MD  lisinopril-hydrochlorothiazide (ZESTORETIC) 10-12.5 MG tablet Take 1 tablet by mouth daily. 05/04/19   [provider]  LORazepam (ATIVAN) 0.5 MG tablet Take 0.5 mg by mouth at bedtime.    [provider]  meloxicam (MOBIC) 15 MG tablet Take 15 mg by mouth daily.     [provider]  metoprolol tartrate (LOPRESSOR) 25 MG tablet Take  1 tablet (25 mg total) by mouth 2 (two) times daily. 07/13/19 10/11/19  Herminio Commons, MD  Multiple Vitamin (MULTIVITAMIN WITH MINERALS) TABS tablet Take 1 tablet by mouth daily.    [provider]  Omega-3 Fatty Acids (FISH OIL) 500 MG CAPS Take 1 capsule by mouth daily.     [provider]  ondansetron (ZOFRAN) 8 MG tablet Take 8 mg by mouth as needed for nausea or vomiting.  07/16/19   [provider]  pantoprazole (PROTONIX) 40 MG tablet Take 40 mg by mouth daily. 06/08/19   [provider]  prochlorperazine (COMPAZINE) 10 MG tablet Take 1 tablet (10 mg total) by mouth every 6 (six) hours as needed for nausea or vomiting. 08/24/19   Derek Jack, MD  promethazine (PHENERGAN) 25 MG tablet Take 25 mg by mouth as needed for nausea or vomiting.  07/09/19   [provider]  sucralfate (CARAFATE) 1 g tablet Take 1 g by mouth 4 (four) times daily -  with meals and at bedtime.  07/02/19   [provider]     Family History  Problem Relation Age of Onset  . Emphysema Mother   . Stroke Father   . Cirrhosis Sister   . Heart disease Maternal Grandfather   . Diabetes Paternal Grandmother   . Colon cancer Paternal Grandfather   . Emphysema Sister   . Colon cancer Sister   . Emphysema Brother   . Thyroid disease Daughter   . Glaucoma Daughter   . Heart disease Son   . Gout Son   . Hypertension Son     Social History   Socioeconomic History  . Marital status: Married    Spouse name: Not on file  . Number of children: 3  . Years of education: Not on file  . Highest education level: Not on file  Occupational History  . Occupation: RETIRED  Tobacco Use  . Smoking status: Former Smoker    Quit date: 1985    Years since quitting: 36.4  . Smokeless tobacco: Never Used  Substance and Sexual Activity  . Alcohol use: No  . Drug use: No  . Sexual activity: Not Currently  Other Topics Concern  . Not on file  Social History Narrative  . Not on file   Social Determinants of Health   Financial Resource Strain: Low Risk   . Difficulty of Paying Living Expenses: Not very hard  Food Insecurity:   . Worried About Charity fundraiser in the Last Year:   . Arboriculturist in the Last Year:   Transportation Needs: No Transportation Needs  . Lack of Transportation (Medical): No  . Lack of Transportation (Non-Medical): No  Physical Activity:   .  Days of Exercise per Week:   . Minutes of Exercise per Session:   Stress: Stress Concern Present  . Feeling of Stress : To some extent  Social Connections:   . Frequency of Communication with Friends and Family:   . Frequency of Social Gatherings with Friends and Family:   . Attends Religious Services:   . Active Member of Clubs or Organizations:   . Attends Archivist Meetings:   Marland Kitchen Marital Status:     Review of Systems: A 12 point ROS discussed and pertinent positives are indicated in the HPI above.  All other systems are negative.  Review of Systems  Constitutional: Positive for activity change, appetite change and fatigue.  Respiratory: Negative for cough and  shortness of breath.   Cardiovascular: Negative for chest pain and leg swelling.  Gastrointestinal: Positive for constipation. Negative for abdominal distention, abdominal pain, nausea and vomiting.  Musculoskeletal: Negative for back pain.  Neurological: Negative for headaches.    Vital Signs: There were no vitals taken for this visit.  Physical Exam Constitutional:      General: He is not in acute distress. HENT:     Mouth/Throat:     Mouth: Mucous membranes are dry.  Cardiovascular:     Rate and Rhythm: Normal rate and regular rhythm.     Pulses: Normal pulses.     Heart sounds: Normal heart sounds.  Pulmonary:     Effort: Pulmonary effort is normal.     Breath sounds: Normal breath sounds.  Abdominal:     General: Bowel sounds are normal.     Palpations: Abdomen is soft.  Skin:    General: Skin is warm and dry.  Neurological:     Mental Status: He is alert and oriented to person, place, and time.  Psychiatric:        Mood and Affect: Mood normal.        Behavior: Behavior normal.        Thought Content: Thought content normal.        Judgment: Judgment normal.     Imaging: US Abdomen Limited  Result Date: 08/04/2019 CLINICAL DATA:  Anterior left hepatic dome lesion by PET-CT. Findings  concerning for metastatic colon cancer by imaging. Assess for biopsy. EXAM: ULTRASOUND ABDOMEN LIMITED COMPARISON:  07/06/2019, 07/27/2019 FINDINGS: Ultrasound performed of the liver through the right mid axillary line intercostal space and a subxiphoid areas to localize the lesion for biopsy. Unfortunately, the left hepatic dome lesion cannot be safely localized or demonstrated by ultrasound to perform biopsy. By comparison CT the lesion is in the anterior left hepatic dome beneath the diaphragm and deep to the sternum and xiphoid area. This also is not a good location to target with CT. Findings discussed with Dr. Lamonte Richer. He will see the patient back in the office to discuss the next step. Consider pulmonary referral for bronchi versus biopsy of the mediastinal nodes versus GI referral for colonoscopy and biopsy of the right colon lesion. IMPRESSION: Unable to visualize the left hepatic dome lesion by ultrasound for biopsy. See above comment. Electronically Signed   By: Jerilynn Mages.  Shick M.D.   On: 08/04/2019 14:32    Labs:  CBC: Recent Labs    06/15/19 1654 07/06/19 1823 08/27/19 1033  WBC 9.1 12.8* 10.9*  HGB 12.4* 11.9* 9.9*  HCT 41.0 38.1* 31.6*  PLT 345 554* 582*    COAGS: Recent Labs    08/04/19 1125  INR 1.2    BMP: Recent Labs    06/15/19 1654 07/06/19 1823  NA 134* 132*  K 3.8 4.1  CL 101 95*  CO2 22 25  GLUCOSE 140* 118*  BUN 17 18  CALCIUM 8.6* 8.9  CREATININE 1.02 1.09  GFRNONAA >60 >60  GFRAA >60 >60    LIVER FUNCTION TESTS: Recent Labs    06/15/19 1854 07/06/19 1823  BILITOT 0.4 0.3  AST 25 30  ALT 20 22  ALKPHOS 75 69  PROT 7.4 8.1  ALBUMIN 3.6 3.5    TUMOR MARKERS: No results for input(s): AFPTM, CEA, CA199, CHROMGRNA in the last 8760 hours.  Assessment and Plan:  Stage IVC colon cancer with widespread metastases: The patient presents to the [facility] Interventional Radiology department for the  placement of a port-a-cath to facilitate his  cancer treatment plans.   Labs and vitals have been reviewed. Patient has been NPO.  Risks and benefits of an image-guided port-a-catheter placement were discussed with the patient including, but not limited to bleeding, infection, pneumothorax, or fibrin sheath development and need for additional procedures.  All of the patient's questions were answered, patient is agreeable to proceed.  Consent signed and in chart.  Thank you for this interesting consult.  I greatly enjoyed meeting Richard Conway and look forward to participating in their care.  A copy of this report was sent to the requesting provider on this date.  Electronically Signed: Theresa Duty, NP 08/27/2019, 10:52 AM   I spent a total of  30 Minutes   in face to face in clinical consultation, greater than 50% of which was counseling/coordinating care for placement of a port-a-cath.

## 2019-08-27 NOTE — Procedures (Signed)
Pre Procedure Dx: Metastatic colon cancer Post Procedural Dx: Same  Successful placement of right IJ approach port-a-cath with tip at the superior caval atrial junction. The catheter is ready for immediate use.  Estimated Blood Loss: Minimal  Complications: None immediate.  Ronny Bacon, MD Pager #: [phone]

## 2019-08-28 ENCOUNTER — Other Ambulatory Visit (HOSPITAL_COMMUNITY): Payer: Medicare Other

## 2019-09-02 ENCOUNTER — Other Ambulatory Visit: Payer: Self-pay

## 2019-09-02 ENCOUNTER — Inpatient Hospital Stay (HOSPITAL_COMMUNITY): Payer: Medicare Other | Attending: Hematology | Admitting: Hematology

## 2019-09-02 VITALS — BP 113/72 | HR 64 | Temp 96.9°F | Resp 19 | Wt 199.2 lb

## 2019-09-02 DIAGNOSIS — M545 Low back pain: Secondary | ICD-10-CM | POA: Diagnosis not present

## 2019-09-02 DIAGNOSIS — Z452 Encounter for adjustment and management of vascular access device: Secondary | ICD-10-CM | POA: Insufficient documentation

## 2019-09-02 DIAGNOSIS — K5900 Constipation, unspecified: Secondary | ICD-10-CM | POA: Insufficient documentation

## 2019-09-02 DIAGNOSIS — I2510 Atherosclerotic heart disease of native coronary artery without angina pectoris: Secondary | ICD-10-CM | POA: Diagnosis not present

## 2019-09-02 DIAGNOSIS — I10 Essential (primary) hypertension: Secondary | ICD-10-CM | POA: Diagnosis not present

## 2019-09-02 DIAGNOSIS — F329 Major depressive disorder, single episode, unspecified: Secondary | ICD-10-CM | POA: Insufficient documentation

## 2019-09-02 DIAGNOSIS — C182 Malignant neoplasm of ascending colon: Secondary | ICD-10-CM | POA: Insufficient documentation

## 2019-09-02 DIAGNOSIS — Z5111 Encounter for antineoplastic chemotherapy: Secondary | ICD-10-CM | POA: Insufficient documentation

## 2019-09-02 DIAGNOSIS — C7951 Secondary malignant neoplasm of bone: Secondary | ICD-10-CM | POA: Diagnosis not present

## 2019-09-02 DIAGNOSIS — D649 Anemia, unspecified: Secondary | ICD-10-CM | POA: Insufficient documentation

## 2019-09-02 DIAGNOSIS — R350 Frequency of micturition: Secondary | ICD-10-CM | POA: Diagnosis not present

## 2019-09-02 DIAGNOSIS — Z7189 Other specified counseling: Secondary | ICD-10-CM | POA: Insufficient documentation

## 2019-09-02 DIAGNOSIS — C189 Malignant neoplasm of colon, unspecified: Secondary | ICD-10-CM

## 2019-09-02 DIAGNOSIS — F419 Anxiety disorder, unspecified: Secondary | ICD-10-CM | POA: Diagnosis not present

## 2019-09-02 NOTE — Patient Instructions (Addendum)
[place] at [facility] Discharge Instructions  You were seen today by Dr. Delton Coombes. He went over your recent results. Dr. Delton Coombes wants you to stop taking the benazepril and the lisinopril/HCTZ. Please reduce your metoprolol intake to once daily. Continue taking your pain medication. Take Imodium if you have diarrhea or liquid stools. You were counseled on the chemotherapy therapy and protocol. Dr. Delton Coombes will see you back in 1 week for labs and treatment.   Thank you for choosing [place] at [facility] to provide your oncology and hematology care.  To afford each patient quality time with our provider, please arrive at least 15 minutes before your scheduled appointment time.   If you have a lab appointment with the [facility] please come in thru the  Main Entrance and check in at the main information desk  You need to re-schedule your appointment should you arrive 10 or more minutes late.  We strive to give you quality time with our providers, and arriving late affects you and other patients whose appointments are after yours.  Also, if you no show three or more times for appointments you may be dismissed from the clinic at the providers discretion.     Again, thank you for choosing [facility].  Our hope is that these requests will decrease the amount of time that you wait before being seen by our physicians.       _____________________________________________________________  Should you have questions after your visit to [facility], please contact our office at [phone] between the hours of 8:00 a.m. and 4:30 p.m.  Voicemails left after 4:00 p.m. will not be returned until the following business day.  For prescription refill requests, have your pharmacy contact our office and allow 72 hours.    Cancer Center Support Programs:   > Cancer Support Group  2nd Tuesday of the month 1pm-2pm, Journey Room

## 2019-09-02 NOTE — Progress Notes (Signed)
[facility] [address], [place] [address]   CLINIC:  Medical Oncology/Hematology  PCP:  Richard Squibb, MD [address] Liana Crocker [place] [address] [phone]   REASON FOR VISIT:  Metastatic colon cancer.  CURRENT THERAPY: FOLFOX.  BRIEF ONCOLOGIC HISTORY:  Oncology History  Colon cancer metastasized to multiple sites [facility])  08/24/2019 Initial Diagnosis   Colon cancer metastasized to multiple sites [facility])   08/24/2019 Cancer Staging   Staging form: Colon and Rectum, AJCC 8th Edition - Clinical stage from 08/24/2019: Stage IVC (cTX, cNX, cM1c) - Signed by Richard Jack, MD on 08/24/2019   09/07/2019 -  Chemotherapy   The patient had PALONOSETRON HCL INJECTION 0.25 MG/5ML, 0.25 mg, Intravenous,  Once, 0 of 4 cycles leucovorin 864 mg in dextrose 5 % 250 mL infusion, 400 mg/m2, Intravenous,  Once, 0 of 4 cycles oxaliplatin (ELOXATIN) 185 mg in dextrose 5 % 500 mL chemo infusion, 85 mg/m2, Intravenous,  Once, 0 of 4 cycles fluorouracil (ADRUCIL) chemo injection 850 mg, 400 mg/m2, Intravenous,  Once, 0 of 4 cycles fluorouracil (ADRUCIL) 5,200 mg in sodium chloride 0.9 % 146 mL chemo infusion, 2,400 mg/m2, Intravenous, 1 Day/Dose, 0 of 4 cycles bevacizumab-bvzr (ZIRABEV) 500 mg in sodium chloride 0.9 % 100 mL chemo infusion, 5 mg/kg, Intravenous,  Once, 0 of 4 cycles  for chemotherapy treatment.      CANCER STAGING: Cancer Staging Colon cancer metastasized to multiple sites [facility]) Staging form: Colon and Rectum, AJCC 8th Edition - Clinical stage from 08/24/2019: Stage IVC (cTX, cNX, cM1c) - Signed by Richard Jack, MD on 08/24/2019   INTERVAL HISTORY:  [name], a 74 y.o. male, returns for routine follow-up of his metastatic colon cancer. [name] was last seen on 08/24/2019.   He reports no changes since his last visit. He reports trouble sleeping due to the lower back midline pain. He is not as active due to his pain, palpitations and  dizziness. He is taking lactulose daily for his constipation.  He is taking hydrocodone for his back pain.  He reports that constipation is poorly controlled with once a day lactulose.    REVIEW OF SYSTEMS:  Review of Systems  Constitutional: Positive for appetite change (Severely decreased) and fatigue (Severe).  Respiratory: Positive for cough and shortness of breath.   Cardiovascular: Positive for palpitations.  Gastrointestinal: Positive for constipation and nausea.  Genitourinary:        Urinary hesitancy  Musculoskeletal: Positive for back pain (Midline low back pain 5/10) and myalgias (Back of R leg).  Neurological: Positive for dizziness.  Psychiatric/Behavioral: Positive for depression and sleep disturbance.  All other systems reviewed and are negative.   PAST MEDICAL/SURGICAL HISTORY:  Past Medical History:  Diagnosis Date  . Arthritis   . CAD (coronary artery disease)   . Hypertension   . Renal disorder    kidney stones   Past Surgical History:  Procedure Laterality Date  . BIOPSY  08/18/2019   Procedure: BIOPSY;  Surgeon: Richard Dolin, MD;  Location: [facility];  Service: Endoscopy;;  . CHOLECYSTECTOMY    . COLONOSCOPY WITH PROPOFOL N/A 08/18/2019   Procedure: COLONOSCOPY WITH PROPOFOL;  Surgeon: Richard Dolin, MD;  Location: [facility];  Service: Endoscopy;  Laterality: N/A;  2:45pm, ok per Richard Conway  . IR IMAGING GUIDED PORT INSERTION  08/27/2019  . KNEE SURGERY    . LITHOTRIPSY      SOCIAL HISTORY:  Social History  Socioeconomic History  . Marital status: Married    Spouse name: Not on file  . Number of children: 3  . Years of education: Not on file  . Highest education level: Not on file  Occupational History  . Occupation: RETIRED  Tobacco Use  . Smoking status: Former Smoker    Quit date: 1985    Years since quitting: 36.4  . Smokeless tobacco: Never Used  Substance and Sexual Activity  . Alcohol use: No  . Drug use: No  . Sexual  activity: Not Currently  Other Topics Concern  . Not on file  Social History Narrative  . Not on file   Social Determinants of Health   Financial Resource Strain: Low Risk   . Difficulty of Paying Living Expenses: Not very hard  Food Insecurity:   . Worried About Charity fundraiser in the Last Year:   . Arboriculturist in the Last Year:   Transportation Needs: No Transportation Needs  . Lack of Transportation (Medical): No  . Lack of Transportation (Non-Medical): No  Physical Activity:   . Days of Exercise per Week:   . Minutes of Exercise per Session:   Stress: Stress Concern Present  . Feeling of Stress : To some extent  Social Connections:   . Frequency of Communication with Friends and Family:   . Frequency of Social Gatherings with Friends and Family:   . Attends Religious Services:   . Active Member of Clubs or Organizations:   . Attends Archivist Meetings:   Marland Kitchen Marital Status:   Intimate Partner Violence: Not At Risk  . Fear of Current or Ex-Partner: No  . Emotionally Abused: No  . Physically Abused: No  . Sexually Abused: No    FAMILY HISTORY:  Family History  Problem Relation Age of Onset  . Emphysema Mother   . Stroke Father   . Cirrhosis Sister   . Heart disease Maternal Grandfather   . Diabetes Paternal Grandmother   . Colon cancer Paternal Grandfather   . Emphysema Sister   . Colon cancer Sister   . Emphysema Brother   . Thyroid disease Daughter   . Glaucoma Daughter   . Heart disease Son   . Gout Son   . Hypertension Son     CURRENT MEDICATIONS:  Current Outpatient Medications  Medication Sig Dispense Refill  . atorvastatin (LIPITOR) 40 MG tablet Take 1 tablet (40 mg total) by mouth daily. 90 tablet 3  . benazepril (LOTENSIN) 10 MG tablet Take 10 mg by mouth daily.    . Cyanocobalamin (B-12 PO) Take by mouth daily.    Marland Kitchen escitalopram (LEXAPRO) 10 MG tablet Take 1 tablet (10 mg total) by mouth daily. 30 tablet 1  . gabapentin  (NEURONTIN) 300 MG capsule Take 300 mg by mouth 3 (three) times daily.    . Glucosamine HCl (GLUCOSAMINE PO) Take 1 tablet by mouth daily.    Marland Kitchen HYDROcodone-acetaminophen (NORCO/VICODIN) 5-325 MG tablet Take 1 tablet by mouth 2 (two) times daily as needed for moderate pain.     . Lactulose 20 GM/30ML SOLN Take 30 mLs (20 g total) by mouth at bedtime. 450 mL 3  . lisinopril-hydrochlorothiazide (ZESTORETIC) 10-12.5 MG tablet Take 1 tablet by mouth daily.    Marland Kitchen LORazepam (ATIVAN) 0.5 MG tablet Take 0.5 mg by mouth at bedtime.    . meloxicam (MOBIC) 15 MG tablet Take 15 mg by mouth daily.     . metoprolol tartrate (LOPRESSOR) 25  MG tablet Take 1 tablet (25 mg total) by mouth 2 (two) times daily. 180 tablet 3  . Multiple Vitamin (MULTIVITAMIN WITH MINERALS) TABS tablet Take 1 tablet by mouth daily.    . Omega-3 Fatty Acids (FISH OIL) 500 MG CAPS Take 1 capsule by mouth daily.     . pantoprazole (PROTONIX) 40 MG tablet Take 40 mg by mouth daily.    . sucralfate (CARAFATE) 1 g tablet Take 1 g by mouth 4 (four) times daily -  with meals and at bedtime.     Marland Kitchen acetaminophen (TYLENOL) 325 MG tablet Take 650 mg by mouth as needed for mild pain.     Marland Kitchen ondansetron (ZOFRAN) 8 MG tablet Take 8 mg by mouth as needed for nausea or vomiting.     . prochlorperazine (COMPAZINE) 10 MG tablet Take 1 tablet (10 mg total) by mouth every 6 (six) hours as needed for nausea or vomiting. (Patient not taking: Reported on 09/02/2019) 60 tablet 2  . promethazine (PHENERGAN) 25 MG tablet Take 25 mg by mouth as needed for nausea or vomiting.      No current facility-administered medications for this visit.    ALLERGIES:  Allergies  Allergen Reactions  . Advil [Ibuprofen] Other (See Comments)    Face and body numbness  . Nyquil Multi-Symptom [Pseudoeph-Doxylamine-Dm-Apap] Other (See Comments)    Increase in bp   . Sulfa Antibiotics Nausea And Vomiting    PHYSICAL EXAM:  Performance status (ECOG): 1 - Symptomatic but  completely ambulatory  Vitals:   09/02/19 1132  BP: 113/72  Pulse: 64  Resp: 19  Temp: (!) 96.9 F (36.1 C)  SpO2: 98%   Wt Readings from Last 3 Encounters:  09/02/19 199 lb 3.2 oz (90.4 kg)  08/24/19 192 lb 4.8 oz (87.2 kg)  08/17/19 196 lb (88.9 kg)   Physical Exam Vitals reviewed.  Constitutional:      Appearance: Normal appearance.  Musculoskeletal:     Lumbar back: Bony tenderness (Low back midline) present.  Skin:    General: Skin is warm.  Neurological:     General: No focal deficit present.     Mental Status: He is alert and oriented to person, place, and time.  Psychiatric:        Mood and Affect: Mood normal.        Behavior: Behavior normal.      LABORATORY DATA:  I have reviewed the labs as listed.  CBC Latest Ref Rng & Units 08/27/2019 07/06/2019 06/15/2019  WBC 4.0 - 10.5 K/uL 10.9(H) 12.8(H) 9.1  Hemoglobin 13.0 - 17.0 g/dL 9.9(L) 11.9(L) 12.4(L)  Hematocrit 39.0 - 52.0 % 31.6(L) 38.1(L) 41.0  Platelets 150 - 400 K/uL 582(H) 554(H) 345   CMP Latest Ref Rng & Units 07/06/2019 06/15/2019 11/01/2012  Glucose 70 - 99 mg/dL 118(H) 140(H) 117(H)  BUN 8 - 23 mg/dL 18 17 11   Creatinine 0.61 - 1.24 mg/dL 1.09 1.02 0.67  Sodium 135 - 145 mmol/L 132(L) 134(L) 138  Potassium 3.5 - 5.1 mmol/L 4.1 3.8 3.6  Chloride 98 - 111 mmol/L 95(L) 101 102  CO2 22 - 32 mmol/L 25 22 23   Calcium 8.9 - 10.3 mg/dL 8.9 8.6(L) 9.3  Total Protein 6.5 - 8.1 g/dL 8.1 7.4 -  Total Bilirubin 0.3 - 1.2 mg/dL 0.3 0.4 -  Alkaline Phos 38 - 126 U/L 69 75 -  AST 15 - 41 U/L 30 25 -  ALT 0 - 44 U/L 22 20 -  DIAGNOSTIC IMAGING:  I have independently reviewed the scans and discussed with the patient. US Abdomen Limited  Result Date: 08/04/2019 CLINICAL DATA:  Anterior left hepatic dome lesion by PET-CT. Findings concerning for metastatic colon cancer by imaging. Assess for biopsy. EXAM: ULTRASOUND ABDOMEN LIMITED COMPARISON:  07/06/2019, 07/27/2019 FINDINGS: Ultrasound performed of the liver  through the right mid axillary line intercostal space and a subxiphoid areas to localize the lesion for biopsy. Unfortunately, the left hepatic dome lesion cannot be safely localized or demonstrated by ultrasound to perform biopsy. By comparison CT the lesion is in the anterior left hepatic dome beneath the diaphragm and deep to the sternum and xiphoid area. This also is not a good location to target with CT. Findings discussed with Dr. Lamonte Richer. He will see the patient back in the office to discuss the next step. Consider pulmonary referral for bronchi versus biopsy of the mediastinal nodes versus GI referral for colonoscopy and biopsy of the right colon lesion. IMPRESSION: Unable to visualize the left hepatic dome lesion by ultrasound for biopsy. See above comment. Electronically Signed   By: Jerilynn Mages.  Shick M.D.   On: 08/04/2019 14:32   IR IMAGING GUIDED PORT INSERTION  Result Date: 08/27/2019 INDICATION: History of metastatic colon cancer. In need of durable intravenous access for administration of chemotherapy. EXAM: IMPLANTED PORT A CATH PLACEMENT WITH ULTRASOUND AND FLUOROSCOPIC GUIDANCE COMPARISON:  None. MEDICATIONS: Ancef 2 gm IV; The antibiotic was administered within an appropriate time interval prior to skin puncture. ANESTHESIA/SEDATION: Moderate (conscious) sedation was employed during this procedure. A total of Versed 1 mg and Fentanyl 50 mcg was administered intravenously. Moderate Sedation Time: 26 minutes. The patient's level of consciousness and vital signs were monitored continuously by radiology nursing throughout the procedure under my direct supervision. CONTRAST:  None FLUOROSCOPY TIME:  18 seconds (16 mGy) COMPLICATIONS: None immediate. PROCEDURE: The procedure, risks, benefits, and alternatives were explained to the patient. Questions regarding the procedure were encouraged and answered. The patient understands and consents to the procedure. The right neck and chest were prepped with  chlorhexidine in a sterile fashion, and a sterile drape was applied covering the operative field. Maximum barrier sterile technique with sterile gowns and gloves were used for the procedure. A timeout was performed prior to the initiation of the procedure. Local anesthesia was provided with 1% lidocaine with epinephrine. After creating a small venotomy incision, a micropuncture kit was utilized to access the internal jugular vein. Real-time ultrasound guidance was utilized for vascular access including the acquisition of a permanent ultrasound image documenting patency of the accessed vessel. The microwire was utilized to measure appropriate catheter length. A subcutaneous port pocket was then created along the upper chest wall utilizing a combination of sharp and blunt dissection. The pocket was irrigated with sterile saline. A single lumen "ISP" sized power injectable port was chosen for placement. The 8 Fr catheter was tunneled from the port pocket site to the venotomy incision. The port was placed in the pocket. The external catheter was trimmed to appropriate length. At the venotomy, an 8 Fr peel-away sheath was placed over a guidewire under fluoroscopic guidance. The catheter was then placed through the sheath and the sheath was removed. Final catheter positioning was confirmed and documented with a fluoroscopic spot radiograph. The port was accessed with a Huber needle, aspirated and flushed with heparinized saline. The venotomy site was closed with an interrupted 4-0 Vicryl suture. The port pocket incision was closed with interrupted 2-0 Vicryl suture. The skin  was opposed with a running subcuticular 4-0 Vicryl suture. Dermabond and Steri-strips were applied to both incisions. Dressings were applied. The patient tolerated the procedure well without immediate post procedural complication. FINDINGS: After catheter placement, the tip lies within the superior cavoatrial junction. The catheter aspirates and  flushes normally and is ready for immediate use. IMPRESSION: Successful placement of a right internal jugular approach power injectable Port-A-Cath. The catheter is ready for immediate use. Electronically Signed   By: Sandi Mariscal M.D.   On: 08/27/2019 15:40     ASSESSMENT:  1.  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  MMR is preserved. -PET scan on 07/27/2019 showed widespread hypermetabolic metastatic disease in the chest, abdomen and pelvis including mediastinal, right hilar, bilateral subpleural lung nodules, hepatic lesions, right adrenal uptake, abdominal adenopathy, omental nodularity and bone meta stasis. -CEA is mildly elevated at 9.5.  2.  Family history: -Sister died of colon cancer at age 76.  Paternal grandfather had colon cancer. -We will consider genetic testing.   PLAN:  1.  Metastatic ascending colon adenocarcinoma: -We talked about prognosis of metastatic colon cancer in detail. -He had Port-A-Cath placed. -We talked about starting him on FOLFOX based chemotherapy.  We will initially start at lower doses and see how he tolerates it.  He does not have any history of stroke or DVT.  Hence he is a candidate for bevacizumab.  However we will likely add Avastin during second cycle.  No pre-existing neuropathy. -We discussed the side effects of the chemotherapy in detail.  I have called in Compazine for nausea.  He was told to buy over-the-counter Imodium and use it as needed for diarrhea. -  2.  Dizziness: -He reports dizziness when stands up.  We have discontinued benazepril and lisinopril/HCTZ and aspirin 81 mg at last visit. -I have told him to cut back on metoprolol to 25 mg once daily.  3.  Depression: Continue Lexapro 10 mg daily.  4.  Constipation: -He is taking lactulose 30 mL at bedtime.  Constipation is not very well  controlled. -We will increase lactulose to twice daily.    Orders placed this encounter:  Orders Placed This Encounter  Procedures  . CBC with Differential/Platelet  . Comprehensive metabolic panel  . Magnesium  . CEA   Total time spent is 40 minutes with more than 50% of the time spent face-to-face discussing treatment plan, side effects, counseling and coordination of care.  Richard Jack, MD [facility] [place] [phone]   I, Jacqualyn Posey, am acting as a scribe for Dr. Sanda Linger.  I, Richard Jack MD, have reviewed the above documentation for accuracy and completeness, and I agree with the above.

## 2019-09-02 NOTE — Progress Notes (Signed)
START ON PATHWAY REGIMEN - Colorectal     A cycle is every 14 days:     Bevacizumab-xxxx      Oxaliplatin      Leucovorin      Fluorouracil      Fluorouracil   **Always confirm dose/schedule in your pharmacy ordering system**  Patient Characteristics: Distant Metastases, Nonsurgical Candidate, KRAS/NRAS Mutation Positive/Unknown (BRAF V600 Wild-Type/Unknown), Standard Cytotoxic Therapy, First Line Standard Cytotoxic Therapy, Bevacizumab Eligible, PS = 0,1 Tumor Location: Colon Therapeutic Status: Distant Metastases Microsatellite/Mismatch Repair Status: MSS/pMMR BRAF Mutation Status: Awaiting Test Results KRAS/NRAS Mutation Status: Awaiting Test Results Standard Cytotoxic Line of Therapy: First Line Standard Cytotoxic Therapy ECOG Performance Status: 1 Bevacizumab Eligibility: Eligible Intent of Therapy: Non-Curative / Palliative Intent, Discussed with Patient 

## 2019-09-03 ENCOUNTER — Encounter (HOSPITAL_COMMUNITY): Payer: Self-pay | Admitting: *Deleted

## 2019-09-03 NOTE — Progress Notes (Signed)
I emailed pathology and ordered foundation one testing on accession # [id number].

## 2019-09-08 ENCOUNTER — Other Ambulatory Visit: Payer: Self-pay

## 2019-09-08 ENCOUNTER — Encounter (HOSPITAL_COMMUNITY): Payer: Self-pay

## 2019-09-08 ENCOUNTER — Inpatient Hospital Stay (HOSPITAL_COMMUNITY): Payer: Medicare Other | Admitting: General Practice

## 2019-09-08 ENCOUNTER — Inpatient Hospital Stay (HOSPITAL_COMMUNITY): Payer: Medicare Other

## 2019-09-08 DIAGNOSIS — C189 Malignant neoplasm of colon, unspecified: Secondary | ICD-10-CM

## 2019-09-08 DIAGNOSIS — I2510 Atherosclerotic heart disease of native coronary artery without angina pectoris: Secondary | ICD-10-CM | POA: Diagnosis not present

## 2019-09-08 DIAGNOSIS — Z452 Encounter for adjustment and management of vascular access device: Secondary | ICD-10-CM | POA: Diagnosis not present

## 2019-09-08 DIAGNOSIS — C7951 Secondary malignant neoplasm of bone: Secondary | ICD-10-CM | POA: Diagnosis not present

## 2019-09-08 DIAGNOSIS — R350 Frequency of micturition: Secondary | ICD-10-CM | POA: Diagnosis not present

## 2019-09-08 DIAGNOSIS — Z5111 Encounter for antineoplastic chemotherapy: Secondary | ICD-10-CM | POA: Diagnosis not present

## 2019-09-08 DIAGNOSIS — C182 Malignant neoplasm of ascending colon: Secondary | ICD-10-CM | POA: Diagnosis not present

## 2019-09-08 DIAGNOSIS — I10 Essential (primary) hypertension: Secondary | ICD-10-CM | POA: Diagnosis not present

## 2019-09-08 DIAGNOSIS — K5900 Constipation, unspecified: Secondary | ICD-10-CM | POA: Diagnosis not present

## 2019-09-08 DIAGNOSIS — D649 Anemia, unspecified: Secondary | ICD-10-CM | POA: Diagnosis not present

## 2019-09-08 DIAGNOSIS — M545 Low back pain: Secondary | ICD-10-CM | POA: Diagnosis not present

## 2019-09-08 DIAGNOSIS — Z95828 Presence of other vascular implants and grafts: Secondary | ICD-10-CM

## 2019-09-08 HISTORY — DX: Presence of other vascular implants and grafts: Z95.828

## 2019-09-08 LAB — CBC WITH DIFFERENTIAL/PLATELET
Abs Immature Granulocytes: 0.07 10*3/uL (ref 0.00–0.07)
Basophils Absolute: 0 10*3/uL (ref 0.0–0.1)
Basophils Relative: 0 %
Eosinophils Absolute: 0.1 10*3/uL (ref 0.0–0.5)
Eosinophils Relative: 1 %
HCT: 31.7 % — ABNORMAL LOW (ref 39.0–52.0)
Hemoglobin: 10 g/dL — ABNORMAL LOW (ref 13.0–17.0)
Immature Granulocytes: 1 %
Lymphocytes Relative: 15 %
Lymphs Abs: 1.7 10*3/uL (ref 0.7–4.0)
MCH: 27.6 pg (ref 26.0–34.0)
MCHC: 31.5 g/dL (ref 30.0–36.0)
MCV: 87.6 fL (ref 80.0–100.0)
Monocytes Absolute: 0.9 10*3/uL (ref 0.1–1.0)
Monocytes Relative: 8 %
Neutro Abs: 8.1 10*3/uL — ABNORMAL HIGH (ref 1.7–7.7)
Neutrophils Relative %: 75 %
Platelets: 643 10*3/uL — ABNORMAL HIGH (ref 150–400)
RBC: 3.62 MIL/uL — ABNORMAL LOW (ref 4.22–5.81)
RDW: 16.9 % — ABNORMAL HIGH (ref 11.5–15.5)
WBC: 10.8 10*3/uL — ABNORMAL HIGH (ref 4.0–10.5)
nRBC: 0 % (ref 0.0–0.2)

## 2019-09-08 LAB — COMPREHENSIVE METABOLIC PANEL
ALT: 37 U/L (ref 0–44)
AST: 60 U/L — ABNORMAL HIGH (ref 15–41)
Albumin: 2.3 g/dL — ABNORMAL LOW (ref 3.5–5.0)
Alkaline Phosphatase: 153 U/L — ABNORMAL HIGH (ref 38–126)
Anion gap: 14 (ref 5–15)
BUN: 13 mg/dL (ref 8–23)
CO2: 23 mmol/L (ref 22–32)
Calcium: 8.2 mg/dL — ABNORMAL LOW (ref 8.9–10.3)
Chloride: 91 mmol/L — ABNORMAL LOW (ref 98–111)
Creatinine, Ser: 0.7 mg/dL (ref 0.61–1.24)
GFR calc Af Amer: >60 mL/min (ref >60)
GFR calc non Af Amer: >60 mL/min (ref >60)
Glucose, Bld: 100 mg/dL — ABNORMAL HIGH (ref 70–99)
Potassium: 3.7 mmol/L (ref 3.5–5.1)
Sodium: 128 mmol/L — ABNORMAL LOW (ref 135–145)
Total Bilirubin: 0.3 mg/dL (ref 0.3–1.2)
Total Protein: 7 g/dL (ref 6.5–8.1)

## 2019-09-08 LAB — MAGNESIUM: Magnesium: 1.8 mg/dL (ref 1.7–2.4)

## 2019-09-08 MED ORDER — LIDOCAINE-PRILOCAINE 2.5-2.5 % EX CREA: TOPICAL_CREAM | CUTANEOUS | 3 refills | Status: AC

## 2019-09-08 MED ORDER — PROCHLORPERAZINE MALEATE 10 MG PO TABS: 10.0000 mg | ORAL_TABLET | Freq: Four times a day (QID) | ORAL | 1 refills | Status: AC | PRN

## 2019-09-08 NOTE — Progress Notes (Signed)
Chemotherapy/immunotherapy education packet given and discussed with pt and family in detail.  Discussed diagnosis and staging, tx regimen, and intent of tx.  Reviewed chemotherapy and immunotherapy medications and side effects, as well as pre-medications.  Instructed on how to manage side effects at home, and when to call the clinic.  Importance of fever/chills discussed with pt and family. Discussed precautions to implement at home after receiving tx, as well as self care strategies. Phone numbers provided for clinic during regular working hours, also how to reach the clinic after hours and on weekends. Pt and family provided the opportunity to ask questions - all questions answered to pt's and family satisfaction.   

## 2019-09-08 NOTE — Progress Notes (Signed)
[facility] Initial Psychosocial Assessment Clinical Social Work  Clinical Social Work met w patient, wife Richard [place] and daughter Richard [place] in exam room to assess psychosocial, emotional, mental health, and spiritual needs of the patient.   Barriers to care/review of distress screen:  - Transportation:  Do you anticipate any problems getting to appointments?  Do you have someone who can help run errands for you if you need it?  HE drives himself and daughter and other family members are available as needed.  Wife has not driven in several years, is nervous about driving.   - Help at home:  What is your living situation (alone, family, other)?  If you are physically unable to care for yourself, who would you call on to help you?  Lives w wife, has been married for 30 years.  Wife has post polio syndrome and also had recent stroke, she may not be physically able to assist if needed.  Daughter, son and their spouses all live nearby and are very involved - Support system:  What does your support system look like?  Who would you call on if you needed some kind of practical help?  What if you needed someone to talk to for emotional support?  Family and friends are supportive and engaged.   - Finances:  Are you concerned about finances.  Considering returning to work?  If not, applying for disability?  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  He worked 36 hours/every two weeks.  Work is not physical.  The facility is holding his job in the event he is able to return.  He is concerned about affording their fixed expenses in light of unexpected household and medical expenses.  Their bills are current, but he is concerned about large bills for medical treatment for cancer.  Advised to contact DSS to see if he can qualify for Medicaid in light of both his and his wife's medical expenses.    What is your understanding  of where you are with your cancer? Its cause?  Your treatment plan and what happens next?  Had heart attack and had CT scan in the workup - found colon cancer metastatic to several areas including liver, lymph nodes.  Was completely surprised that he had cancer and the extent of its spread.  Wife becomes tearful when talking about this situation - "I dont want to lose him."  Will have chemotherapy. Has been told he has a life expectancy of 6 months to 2 years.  He is focusing on making the most of his time w family, biggest sorrow/concern is "leaving my family."  Wife was reminded by daughter that she also has children and grandchildren/great grandchildren; however, patient's diagnosis and prognosis are very distressing to her.    What are your worries for the future as you begin treatment for cancer?  "Leaving my family." Medical bills/cost of care  What are your hopes and priorities during your treatment? What is important to you? What are your goals for your care?  Hopes that he can get some assistance w medical costs. Will be referred to [employer] for any available help w co pays.  Was also advised to talk w his [employer] broker to fully understand what is covered under the terms of his policy.    CSW Summary:  Patient and family psychosocial functioning including strengths, limitations, and coping skills:  74 year  old married male, newly diagnosed w metastatic colon cancer, about to begin infusion chemotherapy.  Lives w wife, strong support from multiple family members.  Very hard of hearing, struggles to hear/understand what is being said, but wants to have full information about his condition and resources.  Matter of fact in his acceptance of his disease and expected treatment process - wife is tearful about his diagnosis.    Identifications of barriers to care:  Cost of care, hearing loss  Availability of community resources:  [place] Worker  follow up needed: No.  Please reconsult as needed.  Richard Shell, LCSW Clinical Social Worker Phone:  [phone] Cell:  [phone]

## 2019-09-08 NOTE — Patient Instructions (Addendum)
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 You will be treated in the clinic every 2 weeks with a combination of chemotherapy and immunotherapy drugs.  Those drugs are oxaliplatin, leucovorin (not chemotherapy), fluorouracil (5FU), and bevacizumab (Avastin).  You will come to the clinic every 2 weeks for treatment.  The intent of treatment is to control your cancer, prevent it from spreading further, and to help alleviate any symptoms you may be having related to your disease.  You will see the doctor regularly throughout treatment.  We will obtain blood work from you prior to every treatment and monitor your results to make sure it is safe to give your treatment. The doctor monitors your response to treatment by the way you are feeling, your blood work, and by obtaining scans periodically. There will be wait times while you are here for treatment.  It will take about 30 minutes to 1 hour for your lab work to result.  Then there will be wait times while pharmacy mixes your medications.    Oxaliplatin (Eloxatin)  About This Drug  Oxaliplatin is used to treat cancer. It is given in the vein (IV).  It takes two hours to infuse.  Possible Side Effects  . Bone marrow suppression. This is a decrease in the number of white blood cells, red blood cells, and platelets. This may raise your risk of infection, make you tired and weak (fatigue), and raise your risk of bleeding.  . Tiredness  . Soreness of the mouth and throat. You may have red areas, white patches, or sores that hurt.  . Nausea and vomiting (throwing up)  . Diarrhea (loose bowel movements)  . Changes in your liver function  . Effects on the nerves called peripheral neuropathy. You may feel numbness, tingling, or pain in your hands and feet, and may be worse in cold temperatures. It may be hard for you to button your clothes, open jars, or walk as usual. The effect on  the nerves may get worse with more doses of the drug. These effects get better in some people after the drug is stopped but it does not get better in all people  Note: Each of the side effects above was reported in 40% or greater of patients treated with oxaliplatin. Not all possible side effects are included above.   Warnings and Precautions  . Allergic reactions, including anaphylaxis, which may be life-threatening are rare but may happen in some patients. Signs of allergic reaction to this drug may be swelling of the face, feeling like your tongue or throat are swelling, trouble breathing, rash, itching, fever, chills, feeling dizzy, and/or feeling that your heart is beating in a fast or not normal way. If this happens, do not take another dose of this drug. You should get urgent medical treatment.  . Inflammation (swelling) of the lungs, which may be life-threatening. You may have a dry cough or trouble breathing.  . Effects on the nerves (neuropathy) may resolve within 14 days, or it may persist beyond 14 days.  . Severe decrease in white blood cells when combined with the chemotherapy agents 5-fluorouracil and leucovorin. This may be life-threatening.  . Severe changes in your liver function  . Abnormal heart beat and/or EKG, which can be life-threatening  . Rhabdomyolysis- damage to your muscles which may release proteins in your blood and affect how your kidneys work, which can be life-threatening. You may have severe muscle weakness and/or  pain, or dark urine.  Important Information  . This drug may impair your ability to drive or use machinery. Talk to your doctor and/or nurse about precautions you may need to take.  . This drug may be present in the saliva, tears, sweat, urine, stool, vomit, semen, and vaginal secretions. Talk to your doctor and/or your nurse about the necessary precautions to take during this time.  * The effects on the nerves can be aggravated by exposure to  cold. Avoid cold beverages, use of ice and make sure you cover your skin and dress warmly prior to being exposed to cold       temperatures while you are receiving treatment with oxaliplatin*   Treating Side Effects  . Manage tiredness by pacing your activities for the day.  . Be sure to include periods of rest between energy-draining activities.  . To decrease the risk of infection, wash your hands regularly.  . Avoid close contact with people who have a cold, the flu, or other infections. . Take your temperature as your doctor or nurse tells you, and whenever you feel like you may have a fever.  . To help decrease the risk of bleeding, use a soft toothbrush. Check with your nurse before using dental floss.  . Be very careful when using knives or tools.  . Use an electric shaver instead of a razor.  . Drink plenty of fluids (a minimum of eight glasses per day is recommended).  . Mouth care is very important. Your mouth care should consist of routine, gentle cleaning of your teeth or dentures and rinsing your mouth with a mixture of 1/2 teaspoon of salt in 8 ounces of water or 1/2 teaspoon of baking soda in 8 ounces of water. This should be done at least after each meal and at bedtime.  . If you have mouth sores, avoid mouthwash that has alcohol. Also avoid alcohol and smoking because they can bother your mouth and throat.  . To help with nausea and vomiting, eat small, frequent meals instead of three large meals a day. Choose foods and drinks that are at room temperature. Ask your nurse or doctor about other helpful tips and medicine that is available to help stop or lessen these symptoms.  . If you throw up or have loose bowel movements, you should drink more fluids so that you do not become dehydrated (lack of water in the body from losing too much fluid).  . If you have diarrhea, eat low-fiber foods that are high in protein and calories and avoid foods that can irritate your  digestive tracts or lead to cramping.  . Ask your nurse or doctor about medicine that can lessen or stop your diarrhea.  . If you have numbness and tingling in your hands and feet, be careful when cooking, walking, and handling sharp objects and hot liquids.  . Do not drink cold drinks or use ice in beverages. Drink fluids at room temperature or warmer, and drink through a straw.  . Wear gloves to touch cold objects, and wear warm clothing and cover you skin during cold weather.   Food and Drug Interactions  . There are no known interactions of oxaliplatin with food and other medications.  . This drug may interact with other medicines. Tell your doctor and pharmacist about all the prescription and over-the-counter medicines and dietary supplements (vitamins, minerals, herbs and others) that you are taking at this time. Also, check with your doctor or pharmacist before starting  any new prescription or over-the-counter medicines, or dietary supplements to make sure that there are no interactions   When to Call the Doctor  Call your doctor or nurse if you have any of these symptoms and/or any new or unusual symptoms:  . Fever of 100.4 F (38 C) or higher  . Chills  . Tiredness that interferes with your daily activities  . Feeling dizzy or lightheaded  . Easy bleeding or bruising  . Feeling that your heart is beating in a fast or not normal way (palpitations)  . Pain in your chest  . Dry cough  . Trouble breathing  . Pain in your mouth or throat that makes it hard to eat or drink  . Nausea that stops you from eating or drinking and/or is not relieved by prescribed medicines  . Throwing up  . Diarrhea, 4 times in one day or diarrhea with lack of strength or a feeling of being dizzy  . Numbness, tingling, or pain in your hands and feet  . Signs of possible liver problems: dark urine, pale bowel movements, bad stomach pain, feeling very tired and weak, unusual itching, or  yellowing of the eyes or skin  . Signs of rhabdomyolysis: decreased urine, very dark urine, muscle pain in the shoulders, thighs, or lower back; muscle weakness or trouble moving arms and legs  . Signs of allergic reaction: swelling of the face, feeling like your tongue or throat are swelling, trouble breathing, rash, itching, fever, chills, feeling dizzy, and/or feeling that your heart is beating in a fast or not normal way. If this happens, call 911 for emergency care.  . If you think you may be pregnant  Reproduction Warnings  . Pregnancy warning: This drug may have harmful effects on the unborn baby. Women of childbearing potential should use effective methods of birth control during your cancer treatment. Let your doctor know right away if you think you may be pregnant or may have impregnated your partner.  . Breastfeeding warning: It is not known if this drug passes into breast milk. For this reason, women should talk to their doctor about the risks and benefits of breastfeeding during treatment with this drug because this drug may enter the breast milk and cause harm to a breastfeeding baby.  . Fertility warning: Human fertility studies have not been done with this drug. Talk with your doctor or nurse if you plan to have children. Ask for information on sperm or egg banking.   Leucovorin Calcium  About This Drug  Leucovorin is a vitamin. It is used in combination with other cancer fighting drugs such as 5-fluorouracil and methotrexate. Leucovorin is given in the vein (IV).  This drug runs at the same time as the oxaliplatin and takes 2 hours to infuse.   Possible Side Effects . Rash and itching  Note: Leucovorin by itself has very few side effects. Other side effects you may have can be caused by the other drugs you are taking, such as 5-fluorouracil.   Warnings and Precautions  . Allergic reactions, including anaphylaxis are rare but may happen in some patients. Signs of  allergic reaction to this drug may be swelling of the face, feeling like your tongue or throat are swelling, trouble breathing, rash, itching, fever, chills, feeling dizzy, and/or feeling that your heart is beating in a fast or not normal way. If this happens, do not take another dose of this drug. You should get urgent medical treatment.  Food and Drug Interactions  .  There are no known interactions of leucovorin with food.  . This drug may interact with other medicines. Tell your doctor and pharmacist about all the prescription and over-the-counter medicines and dietary supplements (vitamins, minerals, herbs and others) that you are taking at this time.  . Also, check with your doctor or pharmacist before starting any new prescription or over-the-counter medicines, or dietary supplements to make sure that there are no interactions.   When to Call the Doctor  Call your doctor or nurse if you have any of these symptoms and/or any new or unusual symptoms:  . A new rash or a rash that is not relieved by prescribed medicines  . Signs of allergic reaction: swelling of the face, feeling like your tongue or throat are swelling, trouble breathing, rash, itching, fever, chills, feeling dizzy, and/or feeling that your heart is beating in a fast or not normal way. If this happens, call 911 for emergency care.  . If you think you may be pregnant   Reproduction Warnings  . Pregnancy warning: It is not known if this drug may harm an unborn child. For this reason, be sure to talk with your doctor if you are pregnant or planning to become pregnant while receiving this drug. Let your doctor know right away if you think you may be pregnant  . Breastfeeding warning: It is not known if this drug passes into breast milk. For this reason, women should talk to their doctor about the risks and benefits of breastfeeding during treatment with this drug because this drug may enter the breast milk and cause harm to a  breastfeeding baby.  . Fertility warning: Human fertility studies have not been done with this drug. Talk with your doctor or nurse if you plan to have children. Ask for information on sperm or egg banking.   5-Fluorouracil (Adrucil; 5FU)  About This Drug  Fluorouracil is used to treat cancer. It is given in the vein (IV). It is given as an IV push from a syringe and also as a continuous infusion given via an ambulatory pump (a pump you take home and wear for a specified amount of time).  Possible Side Effects  . Bone marrow suppression. This is a decrease in the number of white blood cells, red blood cells, and platelets. This may raise your risk of infection, make you tired and weak (fatigue), and raise your risk of bleeding  . Changes in the tissue of the heart and/or heart attack. Some changes may happen that can cause your heart to have less ability to pump blood.  . Blurred vision or other changes in eyesight  . Nausea and throwing up (vomiting)  . Diarrhea (loose bowel movements)  . Ulcers - sores that may cause pain or bleeding in your digestive tract, which includes your mouth, esophagus, stomach, small/large intestines and rectum  . Soreness of the mouth and throat. You may have red areas, white patches, or sores that hurt.  . Allergic reactions, including anaphylaxis are rare but may happen in some patients. Signs of allergic reaction to this drug may be swelling of the face, feeling like your tongue or throat are swelling, trouble breathing, rash, itching, fever, chills, feeling dizzy, and/or feeling that your heart is beating in a fast or not normal way. If this happens, do not take another dose of this drug. You should get urgent medical treatment.  . Sensitivity to light (photosensitivity). Photosensitivity means that you may become more sensitive to the sun and/or  light. You may get a skin rash/reaction if you are in the sun or are exposed to sun lamps and tanning beds.  Your eyes may water more, mostly in bright light.  . Changes in your nail color, nail loss and/or brittle nail  . Darkening of the skin, or changes to the color of your skin and/or veins used for infusion  . Rash, dry skin, or itching  Note: Not all possible side effects are included above.  Warnings and Precautions  . Hand-and-foot syndrome. The palms of your hands or soles of your feet may tingle, become numb, painful, swollen, or red.  . Changes in your central nervous system can happen. The central nervous system is made up of your brain and spinal cord. You could feel extreme tiredness, agitation, confusion, hallucinations (see or hear things that are not there), trouble understanding or speaking, loss of control of your bowels or bladder, eyesight changes, numbness or lack of strength to your arms, legs, face, or body, or coma. If you start to have any of these symptoms let your doctor know right away.  . Side effects of this drug may be unexpectedly severe in some patients  Note: Some of the side effects above are very rare. If you have concerns and/or questions, please discuss them with your medical team.   Important Information  . This drug may be present in the saliva, tears, sweat, urine, stool, vomit, semen, and vaginal secretions. Talk to your doctor and/or your nurse about the necessary precautions to take during this time.   Treating Side Effects  . Manage tiredness by pacing your activities for the day.  . Be sure to include periods of rest between energy-draining activities.  . To help decrease the risk of infections, wash your hands regularly.  . Avoid close contact with people who have a cold, the flu, or other infections.  . Take your temperature as your doctor or nurse tells you, and whenever you feel like you may have a fever.  . Use a soft toothbrush. Check with your nurse before using dental floss.  . Be very careful when using knives or tools.  . Use  an electric shaver instead of a razor.  . If you have a nose bleed, sit with your head tipped slightly forward. Apply pressure by lightly pinching the bridge of your nose between your thumb and forefinger. Call your doctor if you feel dizzy or faint or if the bleeding doesn't stop after 10 to 15 minutes.  . Drink plenty of fluids (a minimum of eight glasses per day is recommended).  . If you throw up or have loose bowel movements, you should drink more fluids so that you do not  become dehydrated (lack of water in the body from losing too much fluid).  . To help with nausea and vomiting, eat small, frequent meals instead of three large meals a day. Choose foods and drinks that are at room temperature. Ask your nurse or doctor about other helpful tips and medicine that is available to help, stop, or lessen these symptoms.  . If you have diarrhea, eat low-fiber foods that are high in protein and calories and avoid foods that can irritate your digestive tracts or lead to cramping.  . Ask your nurse or doctor about medicine that can lessen or stop your diarrhea.  . Mouth care is very important. Your mouth care should consist of routine, gentle cleaning of your teeth or dentures and rinsing your mouth with a  mixture of 1/2 teaspoon of salt in 8 ounces of water or 1/2 teaspoon of baking soda in 8 ounces of water. This should be done at least after each meal and at bedtime.  . If you have mouth sores, avoid mouthwash that has alcohol. Also avoid alcohol and smoking because they can bother your mouth and throat.  Marland Kitchen Keeping your nails moisturized may help with brittleness.  . To help with itching, moisturize your skin several times day.  . Use sunscreen with SPF 30 or higher when you are outdoors even for a short time. Cover up when you are out in the sun. Wear wide-brimmed hats, long-sleeved shirts, and pants. Keep your neck, chest, and back covered. Wear dark sun glasses when in the sun or bright  lights.  . If you get a rash do not put anything on it unless your doctor or nurse says you may. Keep the area around the rash clean and dry. Ask your doctor for medicine if your rash bothers you.  Marland Kitchen Keeping your pain under control is important to your well-being. Please tell your doctor or nurse if you are experiencing pain.   Food and Drug Interactions  . There are no known interactions of fluorouracil with food.  . Check with your doctor or pharmacist about all other prescription medicines and over-the-counter medicines and dietary supplements (vitamins, minerals, herbs and others) you are taking before starting this medicine as there are known drug interactions with 5-fluoroucacil. Also, check with your doctor or pharmacist before starting any new prescription or over-the-counter medicines, or dietary supplements to make sure that there are no interactions.  When to Call the Doctor  Call your doctor or nurse if you have any of these symptoms and/or any new or unusual symptoms:  . Fever of 100.4 F (38 C) or higher  . Chills  . Easy bleeding or bruising  . Nose bleed that doesn't stop bleeding after 10-15 minutes  . Trouble breathing  . Feeling dizzy or lightheaded  . Feeling that your heart is beating in a fast or not normal way (palpitations)  . Chest pain or symptoms of a heart attack. Most heart attacks involve pain in the center of the chest that lasts more than a few minutes. The pain may go away and come back or it can be constant. It can feel like pressure, squeezing, fullness, or pain. Sometimes pain is felt in one or both arms, the back, neck, jaw, or stomach. If any of these symptoms last 2 minutes, call 911.  Marland Kitchen Confusion and/or agitation  . Hallucinations  . Trouble understanding or speaking  . Loss of control of bowels or bladder  . Blurry vision or changes in your eyesight  . Headache that does not go away  . Numbness or lack of strength to your arms,  legs, face, or body  . Nausea that stops you from eating or drinking and/or is not relieved by prescribed medicines  . Throwing up more than 3 times a day  . Diarrhea, 4 times in one day or diarrhea with lack of strength or a feeling of being dizzy  . Pain in your mouth or throat that makes it hard to eat or drink  . Pain along the digestive tract - especially if worse after eating  . Blood in your vomit (bright red or coffee-ground) and/or stools (bright red, or black/tarry)  . Coughing up blood  . Tiredness that interferes with your daily activities  . Painful, red,  or swollen areas on your hands or feet or around your nails  . A new rash or a rash that is not relieved by prescribed medicines  . Develop sensitivity to sunlight/light  . Numbness and/or tingling of your hands and/or feet  . Signs of allergic reaction: swelling of the face, feeling like your tongue or throat are swelling, trouble breathing, rash, itching, fever, chills, feeling dizzy, and/or feeling that your heart is beating in a fast or not normal way. If this happens, call 911 for emergency care.  . If you think you are pregnant or may have impregnated your partner  Reproduction Warnings  . Pregnancy warning: This drug may have harmful effects on the unborn baby. Women of child bearing potential should use effective methods of birth control during your cancer treatment and 3 months after treatment. Men with male partners of childbearing potential should use effective methods of birth control during your cancer treatment and for 3 months after your cancer treatment. Let your doctor know right away if you think you may be pregnant or may have impregnated your partner.  . Breastfeeding warning: It is not known if this drug passes into breast milk. For this reason, Women should not breastfeed during treatment because this drug could enter the breast milk and cause harm to a breastfeeding baby.  . Fertility  warning: In men and women both, this drug may affect your ability to have children in the future. Talk with your doctor or nurse if you plan to have children. Ask for information on sperm or egg banking.   Bevacizumab (Avastin)  About This Drug Bevacizumab is used to treat cancer. It is given in the vein (IV).  Possible Side Effects . Teary eyes  . Runny/stuffy nose  . Nosebleed  . Changes in the way food and drinks taste  . Headache  . Back pain  . Protein in your urine  . Bleeding in your rectum  . Dry skin  . A red skin rash which can be peeling or scaling  . High blood pressure  Note: Each of the side effects above was reported in 10% or greater of patients treated with bevacizumab-xxxx. Not all possible side effects are included above.  Warnings and Precautions  . Perforation or fistula- an abnormal hole in your stomach, intestine, esophagus, or other organ, which can be life-threatening . Slow wound healing, which can be life-threatening . Abnormal bleeding which can be life-threatening - symptoms may be coughing up blood, throwing up blood (may look like coffee grounds), red or black tarry bowel movements, abnormally heavy menstrual flow, nosebleeds or any other unusual bleeding. . Blood clots and events such as stroke and heart attack. A blood clot in your leg may cause your leg to swell, appear red and warm, and/or cause pain. A blood clot in your lungs may cause trouble breathing, pain when breathing, and/or chest pain. . Severe high blood pressure . Changes in your central nervous system can happen. The central nervous system is made up of your brain and spinal cord. You could feel extreme tiredness, agitation, confusion, hallucinations  . This drug may interact with other medicines. Tell your doctor and pharmacist about all the medicines and dietary supplements (vitamins, minerals, herbs and others) that you are taking at this time. Also, check with your doctor  or pharmacist before starting any new prescription or over-the-counter medicines, or dietary supplements to make sure that there are no interactions.  When to Call the Doctor Call your  doctor or nurse if you have any of these symptoms and/or any new or unusual symptoms:  . Fever of 100.4 F (38 C) or higher  . Chills  . Confusion and/or agitation  . Hallucinations  . Trouble understanding or speaking  . Headache that does not go away  . Nose bleed that doesn't stop bleeding after 10 -15 minutes  . Feeling dizzy or lightheaded  . Blurry vision or changes in your eyesight  . Difficulty swallowing  . Easy bleeding or bruising  . Blood in your urine, vomit (bright red or coffee-ground) and/or stools ( bright red, or black/tarry)  . Coughing up blood  . Wheezing and/or trouble breathing  . Chest pain or symptoms of a heart attack. Most heart attacks involve pain in the center of the chest that lasts more than a few minutes. The pain may go away and come back. It can feel like pressure, squeezing, fullness, or pain. Sometimes pain is felt in one or both arms, the back, neck, jaw, or stomach. If any of these symptoms last 2 minutes, call 911.  Marland Kitchen Symptoms of a stroke such as sudden numbness or weakness of your face, arm, or leg, mostly on one side of your body; sudden confusion, trouble speaking or understanding; sudden trouble seeing in one or both eyes; sudden trouble walking, feeling dizzy, loss of balance or coordination; or sudden, bad headache with no known cause. If you have any of these symptoms for 2 minutes, call 911.  . Numbness or lack of strength to your arms, legs, face, or body  . Nausea that stops you from eating or drinking and/or relieved by prescribed medicine  . Throwing up  . Pain in your abdomen that does not go away  . Foamy or bubbly-looking urine  . Signs of infusion reaction: fever or shaking chills, flushing, facial swelling, feeling dizzy, headache,  trouble breathing, rash, itching, chest tightness, or chest pain.  . Pain that does not go away or is not relieved by prescribed medicine  . Your leg or arm is swollen, red, warm and/or painful  . Swelling of arms, hands, legs and/or feet  . Weight gain of 5 pounds in one week (fluid retention)  . If you think you may be pregnant  Reproduction Warnings . Pregnancy warning: This drug can have harmful effects on the unborn baby. Women of child bearing potential should use effective methods of birth control during your cancer treatment and for 6 months after treatment. In women, changes in your ovaries may happen that may cause menstrual bleeding to become irregular or stop, do not assume you cannot get pregnant. Let your doctor know right away if you think you may be pregnant  . Breastfeeding warning: Women should not breastfeed during treatment and for 6 months after treatment because this drug could enter the breast milk and cause harm to a breastfeeding baby.   . Fertility warning: In women, this drug may affect your ability to have children in the future. Talk with your doctor or nurse if you plan to have children. Ask for information on egg banking.     SELF CARE ACTIVITIES WHILE RECEIVING CHEMOTHERAPY:  Hydration Increase your fluid intake 48 hours prior to treatment and drink at least 8 to 12 cups (64 ounces) of water/decaffeinated beverages per day after treatment. You can still have your cup of coffee or soda but these beverages do not count as part of your 8 to 12 cups that you need to drink  daily. No alcohol intake.  Medications Continue taking your normal prescription medication as prescribed.  If you start any new herbal or new supplements please let us know first to make sure it is safe.  Mouth Care Have teeth cleaned professionally before starting treatment. Keep dentures and partial plates clean. Use soft toothbrush and do not use mouthwashes that contain alcohol. Biotene  is a good mouthwash that is available at most pharmacies or may be ordered by calling [phone]. Use warm salt water gargles (1 teaspoon salt per 1 quart warm water) before and after meals and at bedtime. If you need dental work, please let the doctor know before you go for your appointment so that we can coordinate the best possible time for you in regards to your chemo regimen. You need to also let your dentist know that you are actively taking chemo. We may need to do labs prior to your dental appointment.  Skin Care Always use sunscreen that has not expired and with SPF (Sun Protection Factor) of 50 or higher. Wear hats to protect your head from the sun. Remember to use sunscreen on your hands, ears, face, & feet.  Use good moisturizing lotions such as udder cream, eucerin, or even Vaseline. Some chemotherapies can cause dry skin, color changes in your skin and nails.    . Avoid long, hot showers or baths. . Use gentle, fragrance-free soaps and laundry detergent. . Use moisturizers, preferably creams or ointments rather than lotions because the thicker consistency is better at preventing skin dehydration. Apply the cream or ointment within 15 minutes of showering. Reapply moisturizer at night, and moisturize your hands every time after you wash them.  Hair Loss (if your doctor says your hair will fall out)  . If your doctor says that your hair is likely to fall out, decide before you begin chemo whether you want to wear a wig. You may want to shop before treatment to match your hair color. . Hats, turbans, and scarves can also camouflage hair loss, although some people prefer to leave their heads uncovered. If you go bare-headed outdoors, be sure to use sunscreen on your scalp. . Cut your hair short. It eases the inconvenience of shedding lots of hair, but it also can reduce the emotional impact of watching your hair fall out. . Don't perm or color your hair during chemotherapy. Those chemical  treatments are already damaging to hair and can enhance hair loss. Once your chemo treatments are done and your hair has grown back, it's OK to resume dyeing or perming hair.  With chemotherapy, hair loss is almost always temporary. But when it grows back, it may be a different color or texture. In older adults who still had hair color before chemotherapy, the new growth may be completely gray.  Often, new hair is very fine and soft.  Infection Prevention Please wash your hands for at least 30 seconds using warm soapy water. Handwashing is the #1 way to prevent the spread of germs. Stay away from sick people or people who are getting over a cold. If you develop respiratory systems such as green/yellow mucus production or productive cough or persistent cough let us know and we will see if you need an antibiotic. It is a good idea to keep a pair of gloves on when going into grocery stores/[employer] to decrease your risk of coming into contact with germs on the carts, etc. Carry alcohol hand gel with you at all times and use it  frequently if out in public. If your temperature reaches 100.4 or higher please call the clinic and let us know.  If it is after hours or on the weekend please go to the ER if your temperature is over 100.5.  Please have your own personal thermometer at home to use.    Sex and bodily fluids If you are going to have sex, a condom must be used to protect the person that isn't taking chemotherapy. Chemo can decrease your libido (sex drive). For a few days after chemotherapy, chemotherapy can be excreted through your bodily fluids.  When using the toilet please close the lid and flush the toilet twice.  Do this for a few day after you have had chemotherapy.   Effects of chemotherapy on your sex life Some changes are simple and won't last long. They won't affect your sex life permanently.  Sometimes you may feel: . too tired . not strong enough to be very active . sick or sore  . not  in the mood . anxious or low Your anxiety might not seem related to sex. For example, you may be worried about the cancer and how your treatment is going. Or you may be worried about money, or about how you family are coping with your illness.  These things can cause stress, which can affect your interest in sex. It's important to talk to your partner about how you feel.  Remember - the changes to your sex life don't usually last long. There's usually no medical reason to stop having sex during chemo. The drugs won't have any long term physical effects on your performance or enjoyment of sex. Cancer can't be passed on to your partner during sex  Contraception It's important to use reliable contraception during treatment. Avoid getting pregnant while you or your partner are having chemotherapy. This is because the drugs may harm the baby. Sometimes chemotherapy drugs can leave a man or woman infertile.  This means you would not be able to have children in the future. You might want to talk to someone about permanent infertility. It can be very difficult to learn that you may no longer be able to have children. Some people find counselling helpful. There might be ways to preserve your fertility, although this is easier for men than for women. You may want to speak to a fertility expert. You can talk about sperm banking or harvesting your eggs. You can also ask about other fertility options, such as donor eggs. If you have or have had breast cancer, your doctor might advise you not to take the contraceptive pill. This is because the hormones in it might affect the cancer. It is not known for sure whether or not chemotherapy drugs can be passed on through semen or secretions from the vagina. Because of this some doctors advise people to use a barrier method if you have sex during treatment. This applies to vaginal, anal or oral sex. Generally, doctors advise a barrier method only for the time you are actually  having the treatment and for about a week after your treatment. Advice like this can be worrying, but this does not mean that you have to avoid being intimate with your partner. You can still have close contact with your partner and continue to enjoy sex.  Animals If you have cats or birds we just ask that you not change the litter or change the cage.  Please have someone else do this for you while you are  on chemotherapy.   Food Safety During and After Cancer Treatment Food safety is important for people both during and after cancer treatment. Cancer and cancer treatments, such as chemotherapy, radiation therapy, and stem cell/bone marrow transplantation, often weaken the immune system. This makes it harder for your body to protect itself from foodborne illness, also called food poisoning. Foodborne illness is caused by eating food that contains harmful bacteria, parasites, or viruses.  Foods to avoid Some foods have a higher risk of becoming tainted with bacteria. These include: Marland Kitchen Unwashed fresh fruit and vegetables, especially leafy vegetables that can hide dirt and other contaminants . Raw sprouts, such as alfalfa sprouts . Raw or undercooked beef, especially ground beef, or other raw or undercooked meat and poultry . Fatty, fried, or spicy foods immediately before or after treatment.  These can sit heavy on your stomach and make you feel nauseous. . Raw or undercooked shellfish, such as oysters. . Sushi and sashimi, which often contain raw fish.  . Unpasteurized beverages, such as unpasteurized fruit juices, raw milk, raw yogurt, or cider . Undercooked eggs, such as soft boiled, over easy, and poached; raw, unpasteurized eggs; or foods made with raw egg, such as homemade raw cookie dough and homemade mayonnaise  Simple steps for food safety  Shop smart. . Do not buy food stored or displayed in an unclean area. . Do not buy bruised or damaged fruits or vegetables. . Do not buy cans that  have cracks, dents, or bulges. . Pick up foods that can spoil at the end of your shopping trip and store them in a cooler on the way home.  Prepare and clean up foods carefully. . Rinse all fresh fruits and vegetables under running water, and dry them with a clean towel or paper towel. . Clean the top of cans before opening them. . After preparing food, wash your hands for 20 seconds with hot water and soap. Pay special attention to areas between fingers and under nails. . Clean your utensils and dishes with hot water and soap. Marland Kitchen Disinfect your kitchen and cutting boards using 1 teaspoon of liquid, unscented bleach mixed into 1 quart of water.    Dispose of old food. . Eat canned and packaged food before its expiration date (the "use by" or "best before" date). . Consume refrigerated leftovers within 3 to 4 days. After that time, throw out the food. Even if the food does not smell or look spoiled, it still may be unsafe. Some bacteria, such as Listeria, can grow even on foods stored in the refrigerator if they are kept for too long.  Take precautions when eating out. . At restaurants, avoid buffets and salad bars where food sits out for a long time and comes in contact with many people. Food can become contaminated when someone with a virus, often a norovirus, or another "bug" handles it. . Put any leftover food in a "to-go" container yourself, rather than having the server do it. And, refrigerate leftovers as soon as you get home. . Choose restaurants that are clean and that are willing to prepare your food as you order it cooked.   AT HOME MEDICATIONS:  Compazine/Prochlorperazine 10mg  tablet. Take 1 tablet every 6 hours as needed for nausea/vomiting. (This can make you sleepy)   EMLA cream. Apply a quarter size amount to port site 1  hour prior to chemo. Do not rub in. Cover with plastic wrap.    Diarrhea Sheet   If you are having loose stools/diarrhea, please purchase Imodium and begin taking as outlined:  At the first sign of poorly formed or loose stools you should begin taking Imodium (loperamide) 2 mg capsules.  Take two tablets (4mg ) followed by one tablet (2mg ) every 2 hours - DO NOT EXCEED 8 tablets in 24 hours.  If it is bedtime and you are having loose stools, take 2 tablets at bedtime, then 2 tablets every 4 hours until morning.   Always call the [facility] if you are having loose stools/diarrhea that you can't get under control.  Loose stools/diarrhea leads to dehydration (loss of water) in your body.  We have other options of trying to get the loose stools/diarrhea to stop but you must let us know!   Constipation Sheet  Colace - 100 mg capsules - take 2 capsules daily.  If this doesn't help then you can increase to 2 capsules twice daily.  Please call if the above does not work for you. Do not go more than 2 days without a bowel movement.  It is very important that you do not become constipated.  It will make you feel sick to your stomach (nausea) and can cause abdominal pain and vomiting.  Nausea Sheet   Compazine/Prochlorperazine 10mg  tablet. Take 1 tablet every 6 hours as needed for nausea/vomiting (This can make you drowsy).  If you are having persistent nausea (nausea that does not stop) please call the [facility] and let us know the amount of nausea that you are experiencing.  If you begin to vomit, you need to call the [facility] and if it is the weekend and you have vomited more than one time and can't get it to stop-go to the Emergency Room.  Persistent nausea/vomiting can lead to dehydration (loss of fluid in your body) and will make you feel very weak and unwell. Ice chips, sips of clear liquids, foods that are at room temperature, crackers, and toast tend to be better tolerated.   SYMPTOMS  TO REPORT AS SOON AS POSSIBLE AFTER TREATMENT:  FEVER GREATER THAN 100.4 F  CHILLS WITH OR WITHOUT FEVER  NAUSEA AND VOMITING THAT IS NOT CONTROLLED WITH YOUR NAUSEA MEDICATION  UNUSUAL SHORTNESS OF BREATH  UNUSUAL BRUISING OR BLEEDING  TENDERNESS IN MOUTH AND THROAT WITH OR WITHOUT   PRESENCE OF ULCERS  URINARY PROBLEMS  BOWEL PROBLEMS  UNUSUAL RASH      Wear comfortable clothing and clothing appropriate for easy access to any Portacath or PICC line. Let us know if there is anything that we can do to make your therapy better!    What to do if you need assistance after hours or on the weekends: CALL [phone].  HOLD on the line, do not hang up.  You will hear multiple messages but at the end you will be connected with a nurse triage line.  They will contact the doctor if necessary.  Most of the time they will be able to assist you.  Do not call the hospital operator.      I have been informed and understand all of the instructions given to me and have received a copy. I have been instructed to call  the clinic [phone] or my family physician as soon as possible for continued medical care, if indicated. I do not have any more questions at this time but understand that I may call the [facility] or the Patient Navigator at [phone] during office hours should I have questions or need assistance in obtaining follow-up care.

## 2019-09-09 ENCOUNTER — Inpatient Hospital Stay (HOSPITAL_COMMUNITY): Payer: Medicare Other

## 2019-09-09 ENCOUNTER — Telehealth (HOSPITAL_COMMUNITY): Payer: Self-pay | Admitting: Hematology

## 2019-09-09 ENCOUNTER — Other Ambulatory Visit: Payer: Self-pay

## 2019-09-09 ENCOUNTER — Inpatient Hospital Stay (HOSPITAL_BASED_OUTPATIENT_CLINIC_OR_DEPARTMENT_OTHER): Payer: Medicare Other | Admitting: Hematology

## 2019-09-09 ENCOUNTER — Telehealth: Payer: Self-pay

## 2019-09-09 VITALS — BP 83/55 | HR 63 | Temp 97.1°F | Resp 16 | Wt 191.8 lb

## 2019-09-09 VITALS — BP 118/60 | HR 60 | Temp 97.5°F | Resp 17

## 2019-09-09 DIAGNOSIS — M545 Low back pain: Secondary | ICD-10-CM | POA: Diagnosis not present

## 2019-09-09 DIAGNOSIS — E860 Dehydration: Secondary | ICD-10-CM

## 2019-09-09 DIAGNOSIS — Z95828 Presence of other vascular implants and grafts: Secondary | ICD-10-CM

## 2019-09-09 DIAGNOSIS — C189 Malignant neoplasm of colon, unspecified: Secondary | ICD-10-CM | POA: Diagnosis not present

## 2019-09-09 DIAGNOSIS — R350 Frequency of micturition: Secondary | ICD-10-CM | POA: Diagnosis not present

## 2019-09-09 DIAGNOSIS — Z5111 Encounter for antineoplastic chemotherapy: Secondary | ICD-10-CM | POA: Diagnosis not present

## 2019-09-09 DIAGNOSIS — I2510 Atherosclerotic heart disease of native coronary artery without angina pectoris: Secondary | ICD-10-CM | POA: Diagnosis not present

## 2019-09-09 DIAGNOSIS — K5900 Constipation, unspecified: Secondary | ICD-10-CM | POA: Diagnosis not present

## 2019-09-09 DIAGNOSIS — Z452 Encounter for adjustment and management of vascular access device: Secondary | ICD-10-CM | POA: Diagnosis not present

## 2019-09-09 DIAGNOSIS — C7951 Secondary malignant neoplasm of bone: Secondary | ICD-10-CM | POA: Diagnosis not present

## 2019-09-09 DIAGNOSIS — D649 Anemia, unspecified: Secondary | ICD-10-CM | POA: Diagnosis not present

## 2019-09-09 DIAGNOSIS — C182 Malignant neoplasm of ascending colon: Secondary | ICD-10-CM | POA: Diagnosis not present

## 2019-09-09 DIAGNOSIS — I10 Essential (primary) hypertension: Secondary | ICD-10-CM | POA: Diagnosis not present

## 2019-09-09 MED ORDER — OXALIPLATIN CHEMO INJECTION 100 MG/20ML
68.0000 mg/m2 | Freq: Once | INTRAVENOUS | Status: AC
  Administered 2019-09-09: 145 mg via INTRAVENOUS
  Filled 2019-09-09: qty 20

## 2019-09-09 MED ORDER — SODIUM CHLORIDE 0.9 % IV SOLN
1920.0000 mg/m2 | INTRAVENOUS | Status: DC
  Administered 2019-09-09: 4150 mg via INTRAVENOUS
  Filled 2019-09-09: qty 83

## 2019-09-09 MED ORDER — LEUCOVORIN CALCIUM INJECTION 350 MG
324.0000 mg/m2 | Freq: Once | INTRAVENOUS | Status: AC
  Administered 2019-09-09: 700 mg via INTRAVENOUS
  Filled 2019-09-09: qty 35

## 2019-09-09 MED ORDER — CLONIDINE HCL 0.1 MG PO TABS
ORAL_TABLET | ORAL | Status: AC
  Filled 2019-09-09: qty 1

## 2019-09-09 MED ORDER — SODIUM CHLORIDE 0.9 % IV SOLN
10.0000 mg | Freq: Once | INTRAVENOUS | Status: AC
  Administered 2019-09-09: 10 mg via INTRAVENOUS
  Filled 2019-09-09: qty 10

## 2019-09-09 MED ORDER — DEXTROSE 5 % IV SOLN: Freq: Once | INTRAVENOUS | Status: AC

## 2019-09-09 MED ORDER — SODIUM CHLORIDE 0.9 % IV SOLN
Freq: Once | INTRAVENOUS | Status: AC
  Filled 2019-09-09: qty 1000

## 2019-09-09 MED ORDER — CLONIDINE HCL 0.1 MG PO TABS: 0.1000 mg | ORAL_TABLET | Freq: Once | ORAL | Status: DC

## 2019-09-09 MED ORDER — SODIUM CHLORIDE 0.9% FLUSH
10.0000 mL | INTRAVENOUS | Status: DC | PRN
  Administered 2019-09-09: 10 mL

## 2019-09-09 MED ORDER — FLUOROURACIL CHEMO INJECTION 2.5 GM/50ML
320.0000 mg/m2 | Freq: Once | INTRAVENOUS | Status: AC
  Administered 2019-09-09: 700 mg via INTRAVENOUS
  Filled 2019-09-09: qty 14

## 2019-09-09 MED ORDER — PALONOSETRON HCL INJECTION 0.25 MG/5ML
0.2500 mg | Freq: Once | INTRAVENOUS | Status: AC
  Administered 2019-09-09: 0.25 mg via INTRAVENOUS
  Filled 2019-09-09: qty 5

## 2019-09-09 NOTE — Telephone Encounter (Signed)
Returned call. No answer. Left msg to call back  

## 2019-09-09 NOTE — Progress Notes (Signed)
09/09/19  IV fluid with electrolytes:  NS 0.9% 1000 ml + potassium chloride 20 meq + magnesium sulfate 2 gm IV over 2 hours today.  T.O. Dr Rhys Martini, PharmD  . Pharmacist Chemotherapy Monitoring - Initial Assessment    Anticipated start date: 09/06/19   Regimen:  . Are orders appropriate based on the patient's diagnosis, regimen, and cycle? Yes . Does the plan date match the patient's scheduled date? Yes . Is the sequencing of drugs appropriate? Yes . Are the premedications appropriate for the patient's regimen? Yes . Prior Authorization for treatment is: Approved o If applicable, is the correct biosimilar selected based on the patient's insurance? not applicable  Organ Function and Labs: Marland Kitchen Are dose adjustments needed based on the patient's renal function, hepatic function, or hematologic function? No . Are appropriate labs ordered prior to the start of patient's treatment? Yes . Other organ system assessment, if indicated: N/A . The following baseline labs, if indicated, have been ordered: N/A  Dose Assessment: . Are the drug doses appropriate? Yes . Are the following correct: o Drug concentrations Yes o IV fluid compatible with drug Yes o Administration routes Yes o Timing of therapy Yes . If applicable, does the patient have documented access for treatment and/or plans for port-a-cath placement? yes . If applicable, have lifetime cumulative doses been properly documented and assessed? not applicable Lifetime Dose Tracking  No doses have been documented on this patient for the following tracked chemicals: Doxorubicin, Epirubicin, Idarubicin, Daunorubicin, Mitoxantrone, Bleomycin, Oxaliplatin, Carboplatin, Liposomal Doxorubicin  o   Toxicity Monitoring/Prevention: . The patient has the following take home antiemetics prescribed: Ondansetron . The patient has the following take home medications prescribed: N/A . Medication allergies and previous infusion related  reactions, if applicable, have been reviewed and addressed. Yes . The patient's current medication list has been assessed for drug-drug interactions with their chemotherapy regimen. no significant drug-drug interactions were identified on review.  Order Review: . Are the treatment plan orders signed? No . Is the patient scheduled to see a provider prior to their treatment? Yes  I verify that I have reviewed each item in the above checklist and answered each question accordingly.  Wynona Neat 09/09/2019 10:36 AM

## 2019-09-09 NOTE — Progress Notes (Signed)
1427-patients blood pressures reviewed with Dr. Delton Coombes with verbal order clonidine 0.1mg  and recheck before discharge.  No complaints by the patient and no s/s of distress noted.    1450-blood pressure rechecked with manual cuff with verbal order to hold the clonidine by Dr. Delton Coombes.  No s/s of distress noted.  No complaints voiced by the patient.      Patient tolerated chemotherapy with no complaints voiced.  Side effects with management reviewed with understanding verbalized.  Port site clean and dry with no bruising or swelling noted at site.  Good blood return noted before and after administration of chemotherapy.  Chemotherapy pump connected and reviewed with the daughter and wife with all questions asked and answered.  Patient left by wheelchair with VSS and no s/s of distress noted.

## 2019-09-09 NOTE — Telephone Encounter (Signed)
Please call re:Pt's unstable Richard Conway   Thanks renee

## 2019-09-09 NOTE — Progress Notes (Signed)
Patient has been assessed, vital signs and labs have been reviewed by Dr. Delton Coombes. ANC, Creatinine, LFTs, and Platelets are within treatment parameters per Dr. Delton Coombes. The patient is good to proceed with treatment at this time. Dose reduction of 20% today for cycle 1; HOLD Avastin today.  Give IVF with electrolytes over 2 hours for decreased Na+ and hypotension.

## 2019-09-09 NOTE — Progress Notes (Signed)
        14:50 pm Dr. Delton Coombes at the bedside. Blood pressure recheck manual by Janan Ridge RN 138/64. Blood pressure reported to Dr. Delton Coombes. Verbal order received to hold Clonidine 0.1mg  PO at this time. Blood pressure reassessed with patient sitting high fowler 118/60 manual. EKG performed at the bedside.

## 2019-09-09 NOTE — Patient Instructions (Signed)
[place] at [facility] Discharge Instructions  You were seen today by Dr. Delton Coombes. He went over your recent results. You can take your pain medication three times a day. You will be prescribed alprazolam for your anxiety. Please increase your protein intake by drinking Ensure or other sources. Stop taking your lisinopril and benazepril/HCTZ. Take your metoprolol only once daily. You will see the NP in 1 week for follow up.   Thank you for choosing [facility] at [facility] to provide your oncology and hematology care.  To afford each patient quality time with our provider, please arrive at least 15 minutes before your scheduled appointment time.   If you have a lab appointment with the [facility] please come in thru the Main Entrance and check in at the main information desk  You need to re-schedule your appointment should you arrive 10 or more minutes late.  We strive to give you quality time with our providers, and arriving late affects you and other patients whose appointments are after yours.  Also, if you no show three or more times for appointments you may be dismissed from the clinic at the providers discretion.     Again, thank you for choosing [facility].  Our hope is that these requests will decrease the amount of time that you wait before being seen by our physicians.       _____________________________________________________________  Should you have questions after your visit to [facility], please contact our office at [phone] between the hours of 8:00 a.m. and 4:30 p.m.  Voicemails left after 4:00 p.m. will not be returned until the following business day.  For prescription refill requests, have your pharmacy contact our office and allow 72 hours.    Cancer Center Support Programs:   > Cancer Support Group  2nd Tuesday of the month 1pm-2pm, Journey Room

## 2019-09-09 NOTE — Telephone Encounter (Signed)
Spoke to pt about his financial concerns and informed him of [employer] one-time assistance fund. Pt will bring in financial doc on his next visit.

## 2019-09-09 NOTE — Progress Notes (Signed)
[place],  [address]   CLINIC:  Medical Oncology/Hematology  PCP:  Celene Squibb, MD [address] [place] / [place] [address] [phone]   REASON FOR VISIT:  Follow-up for metastatic colon cancer  PRIOR THERAPY: None  CURRENT THERAPY: FOLFOX & bevacizumab  BRIEF ONCOLOGIC HISTORY:  Oncology History  Colon cancer metastasized to multiple sites [facility])  08/24/2019 Initial Diagnosis   Colon cancer metastasized to multiple sites [facility])   08/24/2019 Cancer Staging   Staging form: Colon and Rectum, AJCC 8th Edition - Clinical stage from 08/24/2019: Stage IVC (cTX, cNX, cM1c) - Signed by Derek Jack, MD on 08/24/2019   09/09/2019 -  Chemotherapy   The patient had palonosetron (ALOXI) injection 0.25 mg, 0.25 mg, Intravenous,  Once, 0 of 4 cycles leucovorin 864 mg in dextrose 5 % 250 mL infusion, 400 mg/m2, Intravenous,  Once, 0 of 4 cycles oxaliplatin (ELOXATIN) 185 mg in dextrose 5 % 500 mL chemo infusion, 85 mg/m2, Intravenous,  Once, 0 of 4 cycles fluorouracil (ADRUCIL) chemo injection 850 mg, 400 mg/m2, Intravenous,  Once, 0 of 4 cycles fluorouracil (ADRUCIL) 5,200 mg in sodium chloride 0.9 % 146 mL chemo infusion, 2,400 mg/m2, Intravenous, 1 Day/Dose, 0 of 4 cycles bevacizumab-bvzr (ZIRABEV) 500 mg in sodium chloride 0.9 % 100 mL chemo infusion, 5 mg/kg, Intravenous,  Once, 0 of 4 cycles  for chemotherapy treatment.      CANCER STAGING: Cancer Staging Colon cancer metastasized to multiple sites [facility]) Staging form: Colon and Rectum, AJCC 8th Edition - Clinical stage from 08/24/2019: Stage IVC (cTX, cNX, cM1c) - Signed by Derek Jack, MD on 08/24/2019   INTERVAL HISTORY:  [name], a 74 y.o. male, returns for routine follow-up and consideration for next cycle of chemotherapy. Elson was last seen on 09/02/2019.  Due for cycle #1 of FOLFOX & bevacizumab today.   He has received his port. Overall, he tells  me he has been not feeling well. His dizziness has worsened over the last 3 days and almost fell several times. He is fatigued and reports having pain in his back which interferes with his sleep and his oxycodone 5 mg is barely helping. He reports getting anxiety and wants help managing it. His appetite is good, though he reports nausea and dry heaving. He reports that the lactulose has helped with his constipation.  Overall, he feels ready for next cycle of chemo today.    REVIEW OF SYSTEMS:  Review of Systems  Constitutional: Positive for fatigue (severe). Negative for appetite change.  Respiratory: Positive for shortness of breath.   Cardiovascular: Negative for leg swelling.  Gastrointestinal: Positive for constipation and nausea (dry heaves).  Musculoskeletal: Positive for back pain (5/10 pain).  Neurological: Positive for dizziness.  All other systems reviewed and are negative.   PAST MEDICAL/SURGICAL HISTORY:  Past Medical History:  Diagnosis Date  . Arthritis   . CAD (coronary artery disease)   . Hypertension   . Port-A-Cath in place 09/08/2019  . Renal disorder    kidney stones   Past Surgical History:  Procedure Laterality Date  . BIOPSY  08/18/2019   Procedure: BIOPSY;  Surgeon: Daneil Dolin, MD;  [facility];  Service: Endoscopy;;  . CHOLECYSTECTOMY    . COLONOSCOPY WITH PROPOFOL N/A 08/18/2019   Procedure: COLONOSCOPY WITH PROPOFOL;  Surgeon: Daneil Dolin, MD;  Location: [facility];  Service: Endoscopy;  Laterality: N/A;  2:45pm, ok per  Melanie  . IR IMAGING GUIDED PORT INSERTION  08/27/2019  . KNEE SURGERY    . LITHOTRIPSY      SOCIAL HISTORY:  Social History   Socioeconomic History  . Marital status: Married    Spouse name: Not on file  . Number of children: 3  . Years of education: Not on file  . Highest education level: Not on file  Occupational History  . Occupation: RETIRED  Tobacco Use  . Smoking status: Former Smoker    Quit  date: 1985    Years since quitting: 36.4  . Smokeless tobacco: Never Used  Substance and Sexual Activity  . Alcohol use: No  . Drug use: No  . Sexual activity: Not Currently  Other Topics Concern  . Not on file  Social History Narrative  . Not on file   Social Determinants of Health   Financial Resource Strain: Low Risk   . Difficulty of Paying Living Expenses: Not very hard  Food Insecurity:   . Worried About Charity fundraiser in the Last Year:   . Arboriculturist in the Last Year:   Transportation Needs: No Transportation Needs  . Lack of Transportation (Medical): No  . Lack of Transportation (Non-Medical): No  Physical Activity:   . Days of Exercise per Week:   . Minutes of Exercise per Session:   Stress: Stress Concern Present  . Feeling of Stress : To some extent  Social Connections:   . Frequency of Communication with Friends and Family:   . Frequency of Social Gatherings with Friends and Family:   . Attends Religious Services:   . Active Member of Clubs or Organizations:   . Attends Archivist Meetings:   Marland Kitchen Marital Status:   Intimate Partner Violence: Not At Risk  . Fear of Current or Ex-Partner: No  . Emotionally Abused: No  . Physically Abused: No  . Sexually Abused: No    FAMILY HISTORY:  Family History  Problem Relation Age of Onset  . Emphysema Mother   . Stroke Father   . Cirrhosis Sister   . Heart disease Maternal Grandfather   . Diabetes Paternal Grandmother   . Colon cancer Paternal Grandfather   . Emphysema Sister   . Colon cancer Sister   . Emphysema Brother   . Thyroid disease Daughter   . Glaucoma Daughter   . Heart disease Son   . Gout Son   . Hypertension Son     CURRENT MEDICATIONS:  Current Outpatient Medications  Medication Sig Dispense Refill  . atorvastatin (LIPITOR) 40 MG tablet Take 1 tablet (40 mg total) by mouth daily. 90 tablet 3  . BEVACIZUMAB IV Inject 5 mg/kg into the vein every 14 (fourteen) days.    .  Cyanocobalamin (B-12 PO) Take by mouth daily.    Marland Kitchen escitalopram (LEXAPRO) 10 MG tablet Take 1 tablet (10 mg total) by mouth daily. 30 tablet 1  . fluorouracil CALGB 97673 in sodium chloride 0.9 % 150 mL Inject 2,400 mg/m2 into the vein over 48 hr.    . FLUOROURACIL IV Inject 400 mg/m2 into the vein every 14 (fourteen) days.    Marland Kitchen gabapentin (NEURONTIN) 300 MG capsule Take 300 mg by mouth 3 (three) times daily.    . Glucosamine HCl (GLUCOSAMINE PO) Take 1 tablet by mouth daily.    . Lactulose 20 GM/30ML SOLN Take 30 mLs (20 g total) by mouth at bedtime. 450 mL 3  . LEUCOVORIN CALCIUM IV Inject  400 mg/m2 into the vein every 14 (fourteen) days.    Marland Kitchen lisinopril-hydrochlorothiazide (ZESTORETIC) 10-12.5 MG tablet Take 1 tablet by mouth daily.    Marland Kitchen LORazepam (ATIVAN) 0.5 MG tablet Take 0.5 mg by mouth at bedtime.    . meloxicam (MOBIC) 15 MG tablet Take 15 mg by mouth daily.     . metoprolol tartrate (LOPRESSOR) 25 MG tablet Take 1 tablet (25 mg total) by mouth 2 (two) times daily. 180 tablet 3  . Multiple Vitamin (MULTIVITAMIN WITH MINERALS) TABS tablet Take 1 tablet by mouth daily.    . Omega-3 Fatty Acids (FISH OIL) 500 MG CAPS Take 1 capsule by mouth daily.     . ondansetron (ZOFRAN) 8 MG tablet Take 8 mg by mouth as needed for nausea or vomiting.     Marland Kitchen oxaliplatin in dextrose 5 % 500 mL Inject 85 mg/m2 into the vein once.    . pantoprazole (PROTONIX) 40 MG tablet Take 40 mg by mouth daily.    . prochlorperazine (COMPAZINE) 10 MG tablet Take 1 tablet (10 mg total) by mouth every 6 (six) hours as needed (Nausea or vomiting). 30 tablet 1  . sucralfate (CARAFATE) 1 g tablet Take 1 g by mouth 4 (four) times daily -  with meals and at bedtime.     Marland Kitchen acetaminophen (TYLENOL) 325 MG tablet Take 650 mg by mouth as needed for mild pain.     . benazepril (LOTENSIN) 10 MG tablet Take 10 mg by mouth daily.    Marland Kitchen HYDROcodone-acetaminophen (NORCO/VICODIN) 5-325 MG tablet Take 1 tablet by mouth 2 (two) times daily  as needed for moderate pain.     Marland Kitchen lidocaine-prilocaine (EMLA) cream Apply a small amount to port a cath site and cover with plastic wrap 1 hour prior to chemotherapy appointments (Patient not taking: Reported on 09/09/2019) 30 g 3  . promethazine (PHENERGAN) 25 MG tablet Take 25 mg by mouth as needed for nausea or vomiting.      No current facility-administered medications for this visit.    ALLERGIES:  Allergies  Allergen Reactions  . Advil [Ibuprofen] Other (See Comments)    Face and body numbness  . Nyquil Multi-Symptom [Pseudoeph-Doxylamine-Dm-Apap] Other (See Comments)    Increase in bp   . Sulfa Antibiotics Nausea And Vomiting    PHYSICAL EXAM:  Performance status (ECOG): 1 - Symptomatic but completely ambulatory  Vitals:   09/09/19 0937  BP: (!) 83/55  Pulse: 63  Resp: 16  Temp: (!) 97.1 F (36.2 C)  SpO2: 97%   Wt Readings from Last 3 Encounters:  09/09/19 191 lb 12.8 oz (87 kg)  09/02/19 199 lb 3.2 oz (90.4 kg)  08/24/19 192 lb 4.8 oz (87.2 kg)   Physical Exam Vitals reviewed.  Constitutional:      Appearance: Normal appearance.  Cardiovascular:     Rate and Rhythm: Normal rate and regular rhythm.     Pulses: Normal pulses.     Heart sounds: Normal heart sounds.  Pulmonary:     Effort: Pulmonary effort is normal.     Breath sounds: Normal breath sounds.  Chest:     Comments: Port on R shoulder Abdominal:     Palpations: Abdomen is soft. There is no mass.     Tenderness: There is no abdominal tenderness.  Musculoskeletal:     Right lower leg: No edema.     Left lower leg: No edema.  Skin:    Coloration: Skin is pale.  Neurological:  General: No focal deficit present.     Mental Status: He is alert and oriented to person, place, and time.  Psychiatric:        Mood and Affect: Mood normal.        Behavior: Behavior normal.     LABORATORY DATA:  I have reviewed the labs as listed.  CBC Latest Ref Rng & Units 09/08/2019 08/27/2019 07/06/2019  WBC  4.0 - 10.5 K/uL 10.8(H) 10.9(H) 12.8(H)  Hemoglobin 13.0 - 17.0 g/dL 10.0(L) 9.9(L) 11.9(L)  Hematocrit 39.0 - 52.0 % 31.7(L) 31.6(L) 38.1(L)  Platelets 150 - 400 K/uL 643(H) 582(H) 554(H)   CMP Latest Ref Rng & Units 09/08/2019 07/06/2019 06/15/2019  Glucose 70 - 99 mg/dL 100(H) 118(H) 140(H)  BUN 8 - 23 mg/dL 13 18 17   Creatinine 0.61 - 1.24 mg/dL 0.70 1.09 1.02  Sodium 135 - 145 mmol/L 128(L) 132(L) 134(L)  Potassium 3.5 - 5.1 mmol/L 3.7 4.1 3.8  Chloride 98 - 111 mmol/L 91(L) 95(L) 101  CO2 22 - 32 mmol/L 23 25 22   Calcium 8.9 - 10.3 mg/dL 8.2(L) 8.9 8.6(L)  Total Protein 6.5 - 8.1 g/dL 7.0 8.1 7.4  Total Bilirubin 0.3 - 1.2 mg/dL 0.3 0.3 0.4  Alkaline Phos 38 - 126 U/L 153(H) 69 75  AST 15 - 41 U/L 60(H) 30 25  ALT 0 - 44 U/L 37 22 20    DIAGNOSTIC IMAGING:  I have independently reviewed the scans and discussed with the patient. IR IMAGING GUIDED PORT INSERTION  Result Date: 08/27/2019 INDICATION: History of metastatic colon cancer. In need of durable intravenous access for administration of chemotherapy. EXAM: IMPLANTED PORT A CATH PLACEMENT WITH ULTRASOUND AND FLUOROSCOPIC GUIDANCE COMPARISON:  None. MEDICATIONS: Ancef 2 gm IV; The antibiotic was administered within an appropriate time interval prior to skin puncture. ANESTHESIA/SEDATION: Moderate (conscious) sedation was employed during this procedure. A total of Versed 1 mg and Fentanyl 50 mcg was administered intravenously. Moderate Sedation Time: 26 minutes. The patient's level of consciousness and vital signs were monitored continuously by radiology nursing throughout the procedure under my direct supervision. CONTRAST:  None FLUOROSCOPY TIME:  18 seconds (16 mGy) COMPLICATIONS: None immediate. PROCEDURE: The procedure, risks, benefits, and alternatives were explained to the patient. Questions regarding the procedure were encouraged and answered. The patient understands and consents to the procedure. The right neck and chest were  prepped with chlorhexidine in a sterile fashion, and a sterile drape was applied covering the operative field. Maximum barrier sterile technique with sterile gowns and gloves were used for the procedure. A timeout was performed prior to the initiation of the procedure. Local anesthesia was provided with 1% lidocaine with epinephrine. After creating a small venotomy incision, a micropuncture kit was utilized to access the internal jugular vein. Real-time ultrasound guidance was utilized for vascular access including the acquisition of a permanent ultrasound image documenting patency of the accessed vessel. The microwire was utilized to measure appropriate catheter length. A subcutaneous port pocket was then created along the upper chest wall utilizing a combination of sharp and blunt dissection. The pocket was irrigated with sterile saline. A single lumen "ISP" sized power injectable port was chosen for placement. The 8 Fr catheter was tunneled from the port pocket site to the venotomy incision. The port was placed in the pocket. The external catheter was trimmed to appropriate length. At the venotomy, an 8 Fr peel-away sheath was placed over a guidewire under fluoroscopic guidance. The catheter was then placed through the sheath and  the sheath was removed. Final catheter positioning was confirmed and documented with a fluoroscopic spot radiograph. The port was accessed with a Huber needle, aspirated and flushed with heparinized saline. The venotomy site was closed with an interrupted 4-0 Vicryl suture. The port pocket incision was closed with interrupted 2-0 Vicryl suture. The skin was opposed with a running subcuticular 4-0 Vicryl suture. Dermabond and Steri-strips were applied to both incisions. Dressings were applied. The patient tolerated the procedure well without immediate post procedural complication. FINDINGS: After catheter placement, the tip lies within the superior cavoatrial junction. The catheter  aspirates and flushes normally and is ready for immediate use. IMPRESSION: Successful placement of a right internal jugular approach power injectable Port-A-Cath. The catheter is ready for immediate use. Electronically Signed   By: Sandi Mariscal M.D.   On: 08/27/2019 15:40     ASSESSMENT:  1. 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  MMR is preserved. -PET scan on 07/27/2019 showed widespread hypermetabolic metastatic disease in the chest, abdomen and pelvis including mediastinal, right hilar, bilateral subpleural lung nodules, hepatic lesions, right adrenal uptake, abdominal adenopathy, omental nodularity and bone meta stasis. -CEA is mildly elevated at 9.5.  2.  Family history: -Sister died of colon cancer at age 65.  Paternal grandfather had colon cancer. -We will consider genetic testing.   PLAN:  1.  Metastatic ascending colon adenocarcinoma: -We talked about prognosis of metastatic colon cancer in detail. -He had Port-A-Cath placed. -We talked about starting him on FOLFOX based chemotherapy.  We will initially start at lower doses and see how he tolerates it.  He does not have any history of stroke or DVT.  Hence he is a candidate for bevacizumab.  However we will likely add Avastin during second cycle.  No pre-existing neuropathy. -We discussed the side effects of the chemotherapy in detail.  I have called in Compazine for nausea.  He was told to buy over-the-counter Imodium and use it as needed for diarrhea. -I will cut back on first cycle of FOLFOX today by 20%.  We'll reevaluate him in 1 week.  2.  Dizziness: -He reports dizziness when stands up.  We have discontinued benazepril and lisinopril/HCTZ and aspirin 81 mg at last visit.  However he mistakenly took them for 2 days.  We we'll give him IV fluids and reassess his blood pressure. -I  have told him to cut back on metoprolol to 25 mg once daily.  3.  Depression: Continue Lexapro 10 mg daily.  4.  Constipation: -He is taking lactulose 30 mL at bedtime.  Constipation is not very well controlled. -We will increase lactulose to twice daily.  5.  Upper and lower back pain: -He is taking hydrocodone 5/325 twice daily which is not helping. -I will increase his hydrocodone to 10/325 3 times a day as needed. -I have counseled him to continue stool softener and lactulose.  6.  Anxiety: -I will start him on Xanax 0.25 mg twice daily as needed.   Orders placed this encounter:  No orders of the defined types were placed in this encounter.    Derek Jack, MD [place] [phone]   I, Milinda Antis, am acting as a scribe for Dr. Sanda Linger.  I, Derek Jack MD, have reviewed the above documentation for accuracy and completeness, and I agree with the above.

## 2019-09-10 MED ORDER — HYDROCODONE-ACETAMINOPHEN 10-325 MG PO TABS: 1.0000 | ORAL_TABLET | Freq: Three times a day (TID) | ORAL | 0 refills | Status: AC | PRN

## 2019-09-10 MED ORDER — ALPRAZOLAM 0.25 MG PO TABS
0.2500 mg | ORAL_TABLET | Freq: Two times a day (BID) | ORAL | 0 refills | Status: DC | PRN
Start: 2019-09-10 — End: 2019-09-22

## 2019-09-11 ENCOUNTER — Inpatient Hospital Stay (HOSPITAL_COMMUNITY): Payer: Medicare Other

## 2019-09-11 ENCOUNTER — Other Ambulatory Visit: Payer: Self-pay

## 2019-09-11 VITALS — BP 84/60 | HR 105 | Temp 96.9°F | Resp 17

## 2019-09-11 DIAGNOSIS — Z5111 Encounter for antineoplastic chemotherapy: Secondary | ICD-10-CM | POA: Diagnosis not present

## 2019-09-11 DIAGNOSIS — Z95828 Presence of other vascular implants and grafts: Secondary | ICD-10-CM

## 2019-09-11 DIAGNOSIS — K5900 Constipation, unspecified: Secondary | ICD-10-CM | POA: Diagnosis not present

## 2019-09-11 DIAGNOSIS — Z452 Encounter for adjustment and management of vascular access device: Secondary | ICD-10-CM | POA: Diagnosis not present

## 2019-09-11 DIAGNOSIS — I2510 Atherosclerotic heart disease of native coronary artery without angina pectoris: Secondary | ICD-10-CM | POA: Diagnosis not present

## 2019-09-11 DIAGNOSIS — R350 Frequency of micturition: Secondary | ICD-10-CM | POA: Diagnosis not present

## 2019-09-11 DIAGNOSIS — M545 Low back pain: Secondary | ICD-10-CM | POA: Diagnosis not present

## 2019-09-11 DIAGNOSIS — C7951 Secondary malignant neoplasm of bone: Secondary | ICD-10-CM | POA: Diagnosis not present

## 2019-09-11 DIAGNOSIS — I10 Essential (primary) hypertension: Secondary | ICD-10-CM | POA: Diagnosis not present

## 2019-09-11 DIAGNOSIS — D649 Anemia, unspecified: Secondary | ICD-10-CM | POA: Diagnosis not present

## 2019-09-11 DIAGNOSIS — C182 Malignant neoplasm of ascending colon: Secondary | ICD-10-CM | POA: Diagnosis not present

## 2019-09-11 DIAGNOSIS — C189 Malignant neoplasm of colon, unspecified: Secondary | ICD-10-CM

## 2019-09-11 MED ORDER — HEPARIN SOD (PORK) LOCK FLUSH 100 UNIT/ML IV SOLN
500.0000 [IU] | Freq: Once | INTRAVENOUS | Status: AC
  Administered 2019-09-11: 500 [IU] via INTRAVENOUS

## 2019-09-11 MED ORDER — SODIUM CHLORIDE 0.9% FLUSH
10.0000 mL | Freq: Once | INTRAVENOUS | Status: AC
  Administered 2019-09-11: 10 mL via INTRAVENOUS

## 2019-09-11 NOTE — Progress Notes (Signed)
Patient is here today for a pump discontinuation.  Patient arrived and left via wheelchair with his daughter and wife.  BP discussed with Francene Finders, NP.  Other vitals signs stable.

## 2019-09-11 NOTE — Progress Notes (Signed)
Nutrition Assessment   Reason for Assessment:  Referral for weight loss and poor appetite   ASSESSMENT:  74 year old male with metastatic colon cancer. Patient receiving folfox and bevacizumab.    Met with patient, wife and daughter.  Patient reports that he is trying to eat but does not really have much of an appetite.  Yesterday was able to eat nutrigrain bar, jello for breakfast.  Ate junior hamburger and fries for supper last night.  Ate some watermelon as well yesterday. Drinking about 2 ensure per day.  Does not really like them.  Reports nausea and constipation.  Reports nausea medications help and taking lactulose to have BM.     Medications: phenergan, Vit B 12, MVI, omega 3, zofran, compazine   Labs: Na 128, glucose 100   Anthropometrics:   Height: 73 inches Weight: 191 lb 12.8 oz UBW: 223 lb on 4/5 BMI: 25 14% weight loss in the last 2 months, signficant  Estimated Energy Needs  Kcals: 2600-3000 Protein: 130-150 g Fluid: > 2.6 L   NUTRITION DIAGNOSIS: Inadequate oral intake related to cancer and cancer related treatment side effects as evidenced by 14% weight loss in the last 2 months, poor po intake   INTERVENTION:  Discussed ways to add more calories and protein in the diet.  Handout provided to wife.   Encouraged trying other oral nutrition supplements boost, carnation breakfast essentials. Contact information provided   MONITORING, EVALUATION, GOAL: weight trends, intake   Next Visit: June 25 after pump removal  Ivana Nicastro B. Zenia Resides, [place], [place] Registered Dietitian [phone] (pager)

## 2019-09-13 ENCOUNTER — Emergency Department (HOSPITAL_COMMUNITY)
Admission: EM | Admit: 2019-09-13 | Discharge: 2019-09-13 | Disposition: A | Discharge status: Home / Self Care | Payer: Medicare Other | Attending: Emergency Medicine | Admitting: Emergency Medicine

## 2019-09-13 ENCOUNTER — Emergency Department (HOSPITAL_COMMUNITY): Payer: Medicare Other

## 2019-09-13 ENCOUNTER — Encounter (HOSPITAL_COMMUNITY): Payer: Self-pay | Admitting: *Deleted

## 2019-09-13 ENCOUNTER — Other Ambulatory Visit: Payer: Self-pay

## 2019-09-13 DIAGNOSIS — Z79899 Other long term (current) drug therapy: Secondary | ICD-10-CM | POA: Insufficient documentation

## 2019-09-13 DIAGNOSIS — I2510 Atherosclerotic heart disease of native coronary artery without angina pectoris: Secondary | ICD-10-CM | POA: Insufficient documentation

## 2019-09-13 DIAGNOSIS — Z886 Allergy status to analgesic agent status: Secondary | ICD-10-CM | POA: Insufficient documentation

## 2019-09-13 DIAGNOSIS — M7989 Other specified soft tissue disorders: Secondary | ICD-10-CM | POA: Diagnosis not present

## 2019-09-13 DIAGNOSIS — I509 Heart failure, unspecified: Secondary | ICD-10-CM | POA: Diagnosis not present

## 2019-09-13 DIAGNOSIS — J90 Pleural effusion, not elsewhere classified: Secondary | ICD-10-CM | POA: Diagnosis not present

## 2019-09-13 DIAGNOSIS — I119 Hypertensive heart disease without heart failure: Secondary | ICD-10-CM | POA: Diagnosis not present

## 2019-09-13 DIAGNOSIS — Z882 Allergy status to sulfonamides status: Secondary | ICD-10-CM | POA: Insufficient documentation

## 2019-09-13 DIAGNOSIS — R220 Localized swelling, mass and lump, head: Secondary | ICD-10-CM | POA: Diagnosis present

## 2019-09-13 DIAGNOSIS — Z87891 Personal history of nicotine dependence: Secondary | ICD-10-CM | POA: Insufficient documentation

## 2019-09-13 DIAGNOSIS — T783XXA Angioneurotic edema, initial encounter: Secondary | ICD-10-CM

## 2019-09-13 DIAGNOSIS — R0602 Shortness of breath: Secondary | ICD-10-CM | POA: Diagnosis not present

## 2019-09-13 HISTORY — DX: Malignant (primary) neoplasm, unspecified: C80.1

## 2019-09-13 LAB — CBC WITH DIFFERENTIAL/PLATELET
Abs Immature Granulocytes: 0.04 10*3/uL (ref 0.00–0.07)
Basophils Absolute: 0 10*3/uL (ref 0.0–0.1)
Basophils Relative: 0 %
Eosinophils Absolute: 0.2 10*3/uL (ref 0.0–0.5)
Eosinophils Relative: 2 %
HCT: 31.6 % — ABNORMAL LOW (ref 39.0–52.0)
Hemoglobin: 10.2 g/dL — ABNORMAL LOW (ref 13.0–17.0)
Immature Granulocytes: 0 %
Lymphocytes Relative: 16 %
Lymphs Abs: 1.6 10*3/uL (ref 0.7–4.0)
MCH: 28.1 pg (ref 26.0–34.0)
MCHC: 32.3 g/dL (ref 30.0–36.0)
MCV: 87.1 fL (ref 80.0–100.0)
Monocytes Absolute: 0.2 10*3/uL (ref 0.1–1.0)
Monocytes Relative: 2 %
Neutro Abs: 7.9 10*3/uL — ABNORMAL HIGH (ref 1.7–7.7)
Neutrophils Relative %: 80 %
Platelets: 593 10*3/uL — ABNORMAL HIGH (ref 150–400)
RBC: 3.63 MIL/uL — ABNORMAL LOW (ref 4.22–5.81)
RDW: 17.5 % — ABNORMAL HIGH (ref 11.5–15.5)
WBC: 9.9 10*3/uL (ref 4.0–10.5)
nRBC: 0 % (ref 0.0–0.2)

## 2019-09-13 LAB — URINALYSIS, ROUTINE W REFLEX MICROSCOPIC
Bilirubin Urine: NEGATIVE
Glucose, UA: NEGATIVE mg/dL
Hgb urine dipstick: NEGATIVE
Ketones, ur: NEGATIVE mg/dL
Leukocytes,Ua: NEGATIVE
Nitrite: NEGATIVE
Protein, ur: 30 mg/dL — AB
Specific Gravity, Urine: 1.031 — ABNORMAL HIGH (ref 1.005–1.030)
pH: 7 (ref 5.0–8.0)

## 2019-09-13 LAB — COMPREHENSIVE METABOLIC PANEL
ALT: 30 U/L (ref 0–44)
AST: 52 U/L — ABNORMAL HIGH (ref 15–41)
Albumin: 2.4 g/dL — ABNORMAL LOW (ref 3.5–5.0)
Alkaline Phosphatase: 127 U/L — ABNORMAL HIGH (ref 38–126)
Anion gap: 15 (ref 5–15)
BUN: 13 mg/dL (ref 8–23)
CO2: 26 mmol/L (ref 22–32)
Calcium: 8.7 mg/dL — ABNORMAL LOW (ref 8.9–10.3)
Chloride: 91 mmol/L — ABNORMAL LOW (ref 98–111)
Creatinine, Ser: 0.58 mg/dL — ABNORMAL LOW (ref 0.61–1.24)
GFR calc Af Amer: >60 mL/min (ref >60)
GFR calc non Af Amer: >60 mL/min (ref >60)
Glucose, Bld: 108 mg/dL — ABNORMAL HIGH (ref 70–99)
Potassium: 5 mmol/L (ref 3.5–5.1)
Sodium: 132 mmol/L — ABNORMAL LOW (ref 135–145)
Total Bilirubin: 1.2 mg/dL (ref 0.3–1.2)
Total Protein: 7.1 g/dL (ref 6.5–8.1)

## 2019-09-13 NOTE — ED Provider Notes (Signed)
Medical screening examination/treatment/procedure(s) were conducted as a shared visit with non-physician practitioner(s) and myself.  I personally evaluated the patient during the encounter.     Patient with onset of upper lip swelling a little bit of lower lip swelling.  Feels as if his tongue is a little thickened started yesterday.  Patient is followed by hematology oncology here currently undergoing chemo.  The patient is also on lisinopril hydrochlorothiazide since February.  That is a possible option.  Patient's chest x-ray without any significant acute findings.  Labs without any significant abnormalities.  Feel that this could be lisinopril related.  Also possibly could be related to his chemotherapy.  We will have him follow-up with hematology oncology clinic tomorrow to see if they can make any adjustments.  Would recommend that he hold his lisinopril aspect of the lisinopril hydrochlorothiazide.  From home hearing is patient is actually had the lisinopril component of it stopped.  So chemo would be the most likely concern.  His lisinopril was stopped a few days ago by his hematologist oncologist to his blood pressure being low  Patient nontoxic no acute distress.   Fredia Sorrow, MD 09/13/19 [phone]

## 2019-09-13 NOTE — ED Notes (Signed)
Pt had first chemo treatment last week   Presents today with pain and swelling to mouth and lips  Also to feet   Here for eval

## 2019-09-13 NOTE — Discharge Instructions (Addendum)
Call your Oncology office tomorrow to let them know you were seen for lip swelling.  Ice to area of swelling.  Return if any problems.

## 2019-09-13 NOTE — ED Triage Notes (Signed)
Patient comes to the ED accompanied by his wife.  Patient reports that he noticed swelling to his mouth and lips with pain one day ago. Patient also reports bilateral leg swelling for one day.  Patient had a recent chemotherapy treatment on June 9th.

## 2019-09-13 NOTE — ED Provider Notes (Signed)
[facility] EMERGENCY DEPARTMENT Provider Note   CSN: [id number] Arrival date & time: 09/13/19  1411     History Chief Complaint  Patient presents with  . Oral Swelling    Richard Conway is a 74 y.o. male.  Pt complains of swelling to his lips.  Pt has colon cancer and had chemotherapy on Friday.  Pt noticed facial swelling yesterday.  Pt denies any throat swelling.  Pt reports he is chronically short of breath.  Pt reports no different than usual.  Pt denies fever, no cough.  No nausea or vomiting.  Pt is being treated here at the cancer center.    The history is provided by the patient. No language interpreter was used.       Past Medical History:  Diagnosis Date  . Arthritis   . CAD (coronary artery disease)   . Cancer ([place])   . Hypertension   . Port-A-Cath in place 09/08/2019  . Renal disorder    kidney stones    Patient Active Problem List   Diagnosis Date Noted  . Port-A-Cath in place 09/08/2019  . Goals of care, counseling/discussion 09/02/2019  . Colon cancer metastasized to multiple sites ([place]) 08/24/2019  . Abnormal PET scan of colon 08/12/2019  . Metastatic cancer ([place]) 07/29/2019    Past Surgical History:  Procedure Laterality Date  . BIOPSY  08/18/2019   Procedure: BIOPSY;  Surgeon: Daneil Dolin, MD;  Location: [facility] ENDO SUITE;  Service: Endoscopy;;  . CHOLECYSTECTOMY    . COLONOSCOPY WITH PROPOFOL N/A 08/18/2019   Procedure: COLONOSCOPY WITH PROPOFOL;  Surgeon: Daneil Dolin, MD;  Location: [facility] ENDO SUITE;  Service: Endoscopy;  Laterality: N/A;  2:45pm, ok per Threasa Beards  . IR IMAGING GUIDED PORT INSERTION  08/27/2019  . KNEE SURGERY    . LITHOTRIPSY         Family History  Problem Relation Age of Onset  . Emphysema Mother   . Stroke Father   . Cirrhosis Sister   . Heart disease Maternal Grandfather   . Diabetes Paternal Grandmother   . Colon cancer Paternal Grandfather   . Emphysema Sister   . Colon cancer Sister   . Emphysema Brother   .  Thyroid disease Daughter   . Glaucoma Daughter   . Heart disease Son   . Gout Son   . Hypertension Son     Social History   Tobacco Use  . Smoking status: Former Smoker    Quit date: 1985    Years since quitting: 36.4  . Smokeless tobacco: Never Used  Vaping Use  . Vaping Use: Never used  Substance Use Topics  . Alcohol use: No  . Drug use: No    Home Medications Prior to Admission medications   Medication Sig Start Date End Date Taking? Authorizing Provider  acetaminophen (TYLENOL) 325 MG tablet Take 650 mg by mouth as needed for mild pain.  Patient not taking: Reported on 09/11/2019    [provider]  ALPRAZolam Duanne Moron) 0.25 MG tablet Take 1 tablet (0.25 mg total) by mouth 2 (two) times daily as needed for anxiety. 09/10/19   Derek Jack, MD  atorvastatin (LIPITOR) 40 MG tablet Take 1 tablet (40 mg total) by mouth daily. 07/13/19 10/11/19  Herminio Commons, MD  benazepril (LOTENSIN) 10 MG tablet Take 10 mg by mouth daily.    [provider]  BEVACIZUMAB IV Inject 5 mg/kg into the vein every 14 (fourteen) days. 09/09/19   [provider]  Cyanocobalamin (B-12 PO) Take by mouth daily.    [provider]  escitalopram (LEXAPRO) 10 MG tablet Take 1 tablet (10 mg total) by mouth daily. 08/24/19   Derek Jack, MD  fluorouracil CALGB 20947 in sodium chloride 0.9 % 150 mL Inject 2,400 mg/m2 into the vein over 48 hr. 09/09/19   [provider]  FLUOROURACIL IV Inject 400 mg/m2 into the vein every 14 (fourteen) days. 09/09/19   [provider]  gabapentin (NEURONTIN) 300 MG capsule Take 300 mg by mouth 3 (three) times daily. 04/27/19   [provider]  Glucosamine HCl (GLUCOSAMINE PO) Take 1 tablet by mouth daily.    [provider]  HYDROcodone-acetaminophen (NORCO) 10-325 MG tablet Take 1 tablet by mouth every 8 (eight) hours as needed. 09/10/19   Derek Jack, MD  Lactulose 20 GM/30ML SOLN Take  30 mLs (20 g total) by mouth at bedtime. 08/24/19   Derek Jack, MD  LEUCOVORIN CALCIUM IV Inject 400 mg/m2 into the vein every 14 (fourteen) days. 09/09/19   [provider]  lidocaine-prilocaine (EMLA) cream Apply a small amount to port a cath site and cover with plastic wrap 1 hour prior to chemotherapy appointments Patient not taking: Reported on 09/11/2019 09/08/19   Derek Jack, MD  lisinopril-hydrochlorothiazide (ZESTORETIC) 10-12.5 MG tablet Take 1 tablet by mouth daily. 05/04/19   [provider]  LORazepam (ATIVAN) 0.5 MG tablet Take 0.5 mg by mouth at bedtime.    [provider]  meloxicam (MOBIC) 15 MG tablet Take 15 mg by mouth daily.     [provider]  metoprolol tartrate (LOPRESSOR) 25 MG tablet Take 1 tablet (25 mg total) by mouth 2 (two) times daily. 07/13/19 10/11/19  Herminio Commons, MD  Multiple Vitamin (MULTIVITAMIN WITH MINERALS) TABS tablet Take 1 tablet by mouth daily.    [provider]  Omega-3 Fatty Acids (FISH OIL) 500 MG CAPS Take 1 capsule by mouth daily.     [provider]  ondansetron (ZOFRAN) 8 MG tablet Take 8 mg by mouth as needed for nausea or vomiting.  Patient not taking: Reported on 09/11/2019 07/16/19   [provider]  oxaliplatin in dextrose 5 % 500 mL Inject 85 mg/m2 into the vein once. 09/09/19   [provider]  pantoprazole (PROTONIX) 40 MG tablet Take 40 mg by mouth daily. 06/08/19   [provider]  prochlorperazine (COMPAZINE) 10 MG tablet Take 1 tablet (10 mg total) by mouth every 6 (six) hours as needed (Nausea or vomiting). Patient not taking: Reported on 09/11/2019 09/08/19   Derek Jack, MD  promethazine (PHENERGAN) 25 MG tablet Take 25 mg by mouth as needed for nausea or vomiting.  Patient not taking: Reported on 09/11/2019 07/09/19   [provider]  sucralfate (CARAFATE) 1 g tablet Take 1 g by mouth 4 (four) times daily -  with meals and at  bedtime.  07/02/19   [provider]    Allergies    Advil [ibuprofen], Nyquil multi-symptom [pseudoeph-doxylamine-dm-apap], and Sulfa antibiotics  Review of Systems   Review of Systems  HENT: Positive for facial swelling.   All other systems reviewed and are negative.   Physical Exam Updated Vital Signs BP 125/87   Pulse 100   Temp 97.9 F (36.6 C)   Resp (!) 26   Ht 6\' 1"  (1.854 m)   Wt 104.8 kg   SpO2 96%   BMI 30.48 kg/m   Physical Exam Vitals and nursing note reviewed.  Constitutional:      Appearance: He is well-developed.  HENT:     Head: Normocephalic.     Right Ear: Tympanic membrane normal.     Left Ear: Tympanic membrane normal.     Nose: Nose normal.     Mouth/Throat:     Comments: Slight swelling lips, throat clear,   Eyes:     Extraocular Movements: Extraocular movements intact.     Pupils: Pupils are equal, round, and reactive to light.  Cardiovascular:     Rate and Rhythm: Normal rate.  Pulmonary:     Effort: Pulmonary effort is normal.  Abdominal:     General: There is no distension.  Musculoskeletal:        General: Normal range of motion.     Cervical back: Normal range of motion.  Skin:    General: Skin is warm.  Neurological:     General: No focal deficit present.     Mental Status: He is alert and oriented to person, place, and time.  Psychiatric:        Mood and Affect: Mood normal.     ED Results / Procedures / Treatments   Labs (all labs ordered are listed, but only abnormal results are displayed) Labs Reviewed  CBC WITH DIFFERENTIAL/PLATELET - Abnormal; Notable for the following components:      Result Value   RBC 3.63 (*)    Hemoglobin 10.2 (*)    HCT 31.6 (*)    RDW 17.5 (*)    Platelets 593 (*)    Neutro Abs 7.9 (*)    All other components within normal limits  COMPREHENSIVE METABOLIC PANEL - Abnormal; Notable for the following components:   Sodium 132 (*)    Chloride 91 (*)    Glucose, Bld 108 (*)     Creatinine, Ser 0.58 (*)    Calcium 8.7 (*)    Albumin 2.4 (*)    AST 52 (*)    Alkaline Phosphatase 127 (*)    All other components within normal limits  URINALYSIS, ROUTINE W REFLEX MICROSCOPIC - Abnormal; Notable for the following components:   Specific Gravity, Urine 1.031 (*)    Protein, ur 30 (*)    Bacteria, UA RARE (*)    All other components within normal limits    EKG None  Radiology DG Chest Port 1 View  Result Date: 09/13/2019 CLINICAL DATA:  Lip swelling and leg swelling since yesterday, history of MI EXAM: PORTABLE CHEST 1 VIEW COMPARISON:  June 15, 2019 and study of 08/27/2019 FINDINGS: RIGHT IJ Port-A-Cath terminating at the distal aspect of the superior vena cava. Opacification in the LEFT hemithorax with blunting of costodiaphragmatic sulcus and complete obscuration of LEFT hemidiaphragm. Heart size is likely stable but partially obscured as well. Increased interstitial markings in the RIGHT chest. Nodular densities projecting over the RIGHT peripheral chest, of uncertain significance not accounted for on prior CT or PET imaging definitively but with pleural nodularity on the prior PET that would have to be more conspicuous and or enlarged to be visible on the current x-ray. Visualized skeletal structures on limited assessment without acute finding. IMPRESSION: 1. LEFT pleural effusion and basilar airspace disease along with generalized increased interstitial markings. Findings may be related to heart failure. Airspace disease at the LEFT lung base remains nonspecific. 2. Increasing nodularity along the peripheral RIGHT chest may represent worsening pleural disease which is an alternative explanation for pleural effusion as well. Electronically Signed   By: Zetta Bills  M.D.   On: 09/13/2019 16:16    Procedures Procedures (including critical care time)  Medications Ordered in ED Medications - No data to display  ED Course  I have reviewed the triage vital signs and  the nursing notes.  Pertinent labs & imaging results that were available during my care of the patient were reviewed by me and considered in my medical decision making (see chart for details).    MDM Rules/Calculators/A&P                          MDM Labs reviewed,  Chest xray reviewed.  Pt may have allergic reaction to chemo.  Pt is not currently on lisinopril.  Pt advised ice pack.  Observation and return if symptoms worsen or change.  Call Dr. Delray Alt tomorrow to let him know about symptoms.  Dr. Rogene Houston in to see and examine  Final Clinical Impression(s) / ED Diagnoses Final diagnoses:  Angioedema of lips, initial encounter    Rx / DC Orders ED Discharge Orders    None    An After Visit Summary was printed and given to the patient.    Fransico Meadow, [place] 09/13/19 1651    Noemi Chapel, MD 09/25/19 [phone]

## 2019-09-14 ENCOUNTER — Other Ambulatory Visit (HOSPITAL_COMMUNITY): Payer: Self-pay | Admitting: *Deleted

## 2019-09-14 MED ORDER — MAGIC MOUTHWASH W/LIDOCAINE
5.0000 mL | Freq: Four times a day (QID) | ORAL | 2 refills | Status: AC | PRN
Start: 2019-09-14 — End: ?

## 2019-09-14 NOTE — Progress Notes (Signed)
Patient called cancer center stating that he had gone to the ER yesterday for mouth sores and swelling. He reports that it is some better but the sores are not any better and he is concerned. I offered to get the patient an appointment to come in and he states that he will just wait until Wednesday to be seen but he would like something for the sores today.  I have called in some magic mouth wash for him to his pharmacy and instructed him on use.  He verbalizes understanding.

## 2019-09-16 ENCOUNTER — Inpatient Hospital Stay (HOSPITAL_COMMUNITY): Payer: Medicare Other | Admitting: Nurse Practitioner

## 2019-09-16 ENCOUNTER — Inpatient Hospital Stay (HOSPITAL_COMMUNITY): Payer: Medicare Other

## 2019-09-16 ENCOUNTER — Other Ambulatory Visit: Payer: Self-pay

## 2019-09-16 DIAGNOSIS — Z452 Encounter for adjustment and management of vascular access device: Secondary | ICD-10-CM | POA: Diagnosis not present

## 2019-09-16 DIAGNOSIS — M545 Low back pain: Secondary | ICD-10-CM | POA: Diagnosis not present

## 2019-09-16 DIAGNOSIS — R350 Frequency of micturition: Secondary | ICD-10-CM | POA: Diagnosis not present

## 2019-09-16 DIAGNOSIS — C189 Malignant neoplasm of colon, unspecified: Secondary | ICD-10-CM

## 2019-09-16 DIAGNOSIS — I2510 Atherosclerotic heart disease of native coronary artery without angina pectoris: Secondary | ICD-10-CM | POA: Diagnosis not present

## 2019-09-16 DIAGNOSIS — I10 Essential (primary) hypertension: Secondary | ICD-10-CM | POA: Diagnosis not present

## 2019-09-16 DIAGNOSIS — D508 Other iron deficiency anemias: Secondary | ICD-10-CM | POA: Diagnosis not present

## 2019-09-16 DIAGNOSIS — C182 Malignant neoplasm of ascending colon: Secondary | ICD-10-CM | POA: Diagnosis not present

## 2019-09-16 DIAGNOSIS — Z0001 Encounter for general adult medical examination with abnormal findings: Secondary | ICD-10-CM | POA: Diagnosis not present

## 2019-09-16 DIAGNOSIS — C7951 Secondary malignant neoplasm of bone: Secondary | ICD-10-CM | POA: Diagnosis not present

## 2019-09-16 DIAGNOSIS — C771 Secondary and unspecified malignant neoplasm of intrathoracic lymph nodes: Secondary | ICD-10-CM | POA: Diagnosis not present

## 2019-09-16 DIAGNOSIS — D649 Anemia, unspecified: Secondary | ICD-10-CM | POA: Diagnosis not present

## 2019-09-16 DIAGNOSIS — R918 Other nonspecific abnormal finding of lung field: Secondary | ICD-10-CM | POA: Diagnosis not present

## 2019-09-16 DIAGNOSIS — Z5111 Encounter for antineoplastic chemotherapy: Secondary | ICD-10-CM | POA: Diagnosis not present

## 2019-09-16 DIAGNOSIS — K5900 Constipation, unspecified: Secondary | ICD-10-CM | POA: Diagnosis not present

## 2019-09-16 LAB — CBC WITH DIFFERENTIAL/PLATELET
Abs Immature Granulocytes: 0.04 10*3/uL (ref 0.00–0.07)
Basophils Absolute: 0 10*3/uL (ref 0.0–0.1)
Basophils Relative: 0 %
Eosinophils Absolute: 0.4 10*3/uL (ref 0.0–0.5)
Eosinophils Relative: 4 %
HCT: 29.9 % — ABNORMAL LOW (ref 39.0–52.0)
Hemoglobin: 9.2 g/dL — ABNORMAL LOW (ref 13.0–17.0)
Immature Granulocytes: 1 %
Lymphocytes Relative: 13 %
Lymphs Abs: 1.1 10*3/uL (ref 0.7–4.0)
MCH: 27.7 pg (ref 26.0–34.0)
MCHC: 30.8 g/dL (ref 30.0–36.0)
MCV: 90.1 fL (ref 80.0–100.0)
Monocytes Absolute: 0.4 10*3/uL (ref 0.1–1.0)
Monocytes Relative: 5 %
Neutro Abs: 6.6 10*3/uL (ref 1.7–7.7)
Neutrophils Relative %: 77 %
Platelets: 490 10*3/uL — ABNORMAL HIGH (ref 150–400)
RBC: 3.32 MIL/uL — ABNORMAL LOW (ref 4.22–5.81)
RDW: 17.8 % — ABNORMAL HIGH (ref 11.5–15.5)
WBC: 8.5 10*3/uL (ref 4.0–10.5)
nRBC: 0 % (ref 0.0–0.2)

## 2019-09-16 LAB — COMPREHENSIVE METABOLIC PANEL
ALT: 26 U/L (ref 0–44)
AST: 40 U/L (ref 15–41)
Albumin: 2.2 g/dL — ABNORMAL LOW (ref 3.5–5.0)
Alkaline Phosphatase: 136 U/L — ABNORMAL HIGH (ref 38–126)
Anion gap: 11 (ref 5–15)
BUN: 18 mg/dL (ref 8–23)
CO2: 24 mmol/L (ref 22–32)
Calcium: 8.3 mg/dL — ABNORMAL LOW (ref 8.9–10.3)
Chloride: 95 mmol/L — ABNORMAL LOW (ref 98–111)
Creatinine, Ser: 0.61 mg/dL (ref 0.61–1.24)
GFR calc Af Amer: >60 mL/min (ref >60)
GFR calc non Af Amer: >60 mL/min (ref >60)
Glucose, Bld: 124 mg/dL — ABNORMAL HIGH (ref 70–99)
Potassium: 4.2 mmol/L (ref 3.5–5.1)
Sodium: 130 mmol/L — ABNORMAL LOW (ref 135–145)
Total Bilirubin: 0.3 mg/dL (ref 0.3–1.2)
Total Protein: 6.6 g/dL (ref 6.5–8.1)

## 2019-09-16 LAB — MAGNESIUM: Magnesium: 1.9 mg/dL (ref 1.7–2.4)

## 2019-09-16 NOTE — Assessment & Plan Note (Signed)
1.  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  MMR is preserved. -PET scan on 07/27/2019 showed widespread hypermetabolic metastatic disease in the chest, abdomen and pelvis including mediastinal, right hilar, bilateral subpleural lung nodules, hepatic lesions, right adrenal uptake, abdominal adenopathy, omental nodularity and bone metastasis. -CEA is mildly elevated at 9.5. -He will be started on FOLFOX based chemotherapy.  He will start it initially lower dose.  He does not have any history of strokes or DVTs.  Hence he is a candidate for bevacizumab.  However we would likely add Avastin during the second cycle.  No pre-existing neuropathy. -First cycle of FOLFOX was given on 09/09/2019 it was reduced by 20%. -He was in the hospital 3 days later with swelling of the face mouth and bilateral ankles.  He also has multiple mouth sores. -Labs checked on 09/16/2018 showed hemoglobin 9.2, WBC 8.5, platelets 490, albumin 2.2. -We will see him back in 1 week with his second cycle.  2.  Dizziness: -He reports of dizziness when standing up.  We have discontinued his benazepril and lisinopril/HCTZ and aspirin 81 mg at last visit. -He will also cut back on his metoprolol to 25 mg once daily. -Blood pressure today is 89/61.  His dizziness has improved however   3.  Constipation: -He is taking lactulose 30 mL at bedtime. -Constipation is not very well controlled. -Lactulose was increased to twice a day.  4.  Upper and lower back pain: -He was taking hydrocodone 5/325 twice daily and was not very well controlled. -He is now taking hydrocodone 10/325 3 times a day as needed. -He reports his pain is under better control with this.  5.  Anxiety: -We have started him on Xanax 0.25 mg twice daily as needed.  6.  Family history: -Sister died of colon cancer at age  12 -Paternal grandfather had colon cancer. -We will consider genetic testing.

## 2019-09-16 NOTE — Progress Notes (Signed)
Richard Conway, [place] [address]   CLINIC:  Medical Oncology/Hematology  PCP:  Celene Squibb, MD [place] [address] [phone]   REASON FOR VISIT: Follow-up for colon cancer  CURRENT THERAPY: FOLFOX  BRIEF ONCOLOGIC HISTORY:  Oncology History  Colon cancer metastasized to multiple sites [facility])  08/24/2019 Initial Diagnosis   Colon cancer metastasized to multiple sites [facility])   08/24/2019 Cancer Staging   Staging form: Colon and Rectum, AJCC 8th Edition - Clinical stage from 08/24/2019: Stage IVC (cTX, cNX, cM1c) - Signed by Derek Jack, MD on 08/24/2019   09/09/2019 -  Chemotherapy   The patient had palonosetron (ALOXI) injection 0.25 mg, 0.25 mg, Intravenous,  Once, 1 of 4 cycles Administration: 0.25 mg (09/09/2019) leucovorin 700 mg in dextrose 5 % 250 mL infusion, 324 mg/m2 = 692 mg (80 % of original dose 400 mg/m2), Intravenous,  Once, 1 of 4 cycles Dose modification: 320 mg/m2 (80 % of original dose 400 mg/m2, Cycle 1, Reason: Patient Age) Administration: 700 mg (09/09/2019) oxaliplatin (ELOXATIN) 145 mg in dextrose 5 % 500 mL chemo infusion, 68 mg/m2 = 145 mg (80 % of original dose 85 mg/m2), Intravenous,  Once, 1 of 4 cycles Dose modification: 68 mg/m2 (80 % of original dose 85 mg/m2, Cycle 1, Reason: Patient Age) Administration: 145 mg (09/09/2019) fluorouracil (ADRUCIL) chemo injection 700 mg, 320 mg/m2 = 700 mg (80 % of original dose 400 mg/m2), Intravenous,  Once, 1 of 4 cycles Dose modification: 320 mg/m2 (80 % of original dose 400 mg/m2, Cycle 1, Reason: Patient Age) Administration: 700 mg (09/09/2019) fluorouracil (ADRUCIL) 4,150 mg in sodium chloride 0.9 % 67 mL chemo infusion, 1,920 mg/m2 = 4,150 mg (80 % of original dose 2,400 mg/m2), Intravenous, 1 Day/Dose, 1 of 4 cycles Dose modification: 1,920 mg/m2 (80 % of original dose 2,400 mg/m2, Cycle 1, Reason: Patient Age) Administration: 4,150 mg  (09/09/2019) bevacizumab-bvzr (ZIRABEV) 500 mg in sodium chloride 0.9 % 100 mL chemo infusion, 5 mg/kg = 500 mg, Intravenous,  Once, 0 of 3 cycles  for chemotherapy treatment.      CANCER STAGING: Cancer Staging Colon cancer metastasized to multiple sites [facility]) Staging form: Colon and Rectum, AJCC 8th Edition - Clinical stage from 08/24/2019: Stage IVC (cTX, cNX, cM1c) - Signed by Derek Jack, MD on 08/24/2019    INTERVAL HISTORY:  Mr. Ledo 74 y.o. male returns for routine follow-up for colon cancer.  Patient reports he is slowly improving since his last chemotherapy.  He reports he is still having mouth sores daily.  He reports it makes it difficult to eat.  However he has been eating as much as it will allow.  He is trying to stay hydrated.  He is using his mouthwash 3-4 times a day.  He reports he is still having lower leg edema.  He is trying to eat more protein in his diet.  He denies any bright red bleeding per rectum or melena.  He does have occasional nausea.  The Zofran does help with this.  Denies any vomiting or diarrhea. Denies any new pains. Had not noticed any recent bleeding such as epistaxis, hematuria or hematochezia. Denies recent chest pain on exertion, shortness of breath on minimal exertion, pre-syncopal episodes, or palpitations. Denies any numbness or tingling in hands or feet. Denies any recent fevers, infections, or recent hospitalizations. Patient reports appetite at 25% and energy level at 0%.    REVIEW OF SYSTEMS:  Review of Systems  HENT:   Positive for mouth sores and trouble swallowing.   Respiratory: Positive for cough.   Gastrointestinal: Positive for constipation.  Neurological: Positive for dizziness.  Psychiatric/Behavioral: Positive for sleep disturbance.  All other systems reviewed and are negative.    PAST MEDICAL/SURGICAL HISTORY:  Past Medical History:  Diagnosis Date   Arthritis    CAD (coronary artery disease)    Cancer ([place])     Hypertension    Port-A-Cath in place 09/08/2019   Renal disorder    kidney stones   Past Surgical History:  Procedure Laterality Date   BIOPSY  08/18/2019   Procedure: BIOPSY;  Surgeon: Daneil Dolin, MD;  [facility];  Service: Endoscopy;;   CHOLECYSTECTOMY     COLONOSCOPY WITH PROPOFOL N/A 08/18/2019   Procedure: COLONOSCOPY WITH PROPOFOL;  Surgeon: Daneil Dolin, MD;  Location: [facility];  Service: Endoscopy;  Laterality: N/A;  2:45pm, ok per Melanie   IR IMAGING GUIDED PORT INSERTION  08/27/2019   KNEE SURGERY     LITHOTRIPSY       SOCIAL HISTORY:  Social History   Socioeconomic History   Marital status: Married    Spouse name: Not on file   Number of children: 3   Years of education: Not on file   Highest education level: Not on file  Occupational History   Occupation: RETIRED  Tobacco Use   Smoking status: Former Smoker    Quit date: 1985    Years since quitting: 36.4   Smokeless tobacco: Never Used  Scientific laboratory technician Use: Never used  Substance and Sexual Activity   Alcohol use: No   Drug use: No   Sexual activity: Not Currently  Other Topics Concern   Not on file  Social History Narrative   Not on file   Social Determinants of Health   Financial Resource Strain: Low Risk    Difficulty of Paying Living Expenses: Not very hard  Food Insecurity:    Worried About Charity fundraiser in the Last Year:    Arboriculturist in the Last Year:   Transportation Needs: No Transportation Needs   Lack of Transportation (Medical): No   Lack of Transportation (Non-Medical): No  Physical Activity:    Days of Exercise per Week:    Minutes of Exercise per Session:   Stress: Stress Concern Present   Feeling of Stress : To some extent  Social Connections:    Frequency of Communication with Friends and Family:    Frequency of Social Gatherings with Friends and Family:    Attends Religious Services:    Active Member  of Clubs or Organizations:    Attends Music therapist:    Marital Status:   Intimate Partner Violence: Not At Risk   Fear of Current or Ex-Partner: No   Emotionally Abused: No   Physically Abused: No   Sexually Abused: No    FAMILY HISTORY:  Family History  Problem Relation Age of Onset   Emphysema Mother    Stroke Father    Cirrhosis Sister    Heart disease Maternal Grandfather    Diabetes Paternal Grandmother    Colon cancer Paternal Grandfather    Emphysema Sister    Colon cancer Sister    Emphysema Brother    Thyroid disease Daughter    Glaucoma Daughter    Heart disease Son    Gout Son    Hypertension Son  CURRENT MEDICATIONS:  Outpatient Encounter Medications as of 09/16/2019  Medication Sig Note   ALPRAZolam (XANAX) 0.25 MG tablet Take 1 tablet (0.25 mg total) by mouth 2 (two) times daily as needed for anxiety.    atorvastatin (LIPITOR) 40 MG tablet Take 1 tablet (40 mg total) by mouth daily.    benazepril (LOTENSIN) 10 MG tablet Take 10 mg by mouth daily. 09/09/2019: On hold   BEVACIZUMAB IV Inject 5 mg/kg into the vein every 14 (fourteen) days.    Cyanocobalamin (B-12 PO) Take by mouth daily.    escitalopram (LEXAPRO) 10 MG tablet Take 1 tablet (10 mg total) by mouth daily.    fluorouracil CALGB 67591 in sodium chloride 0.9 % 150 mL Inject 2,400 mg/m2 into the vein over 48 hr.    FLUOROURACIL IV Inject 400 mg/m2 into the vein every 14 (fourteen) days.    gabapentin (NEURONTIN) 300 MG capsule Take 300 mg by mouth 3 (three) times daily.    Glucosamine HCl (GLUCOSAMINE PO) Take 1 tablet by mouth daily.    HYDROcodone-acetaminophen (NORCO) 10-325 MG tablet Take 1 tablet by mouth every 8 (eight) hours as needed.    Lactulose 20 GM/30ML SOLN Take 30 mLs (20 g total) by mouth at bedtime.    LEUCOVORIN CALCIUM IV Inject 400 mg/m2 into the vein every 14 (fourteen) days.    lidocaine-prilocaine (EMLA) cream Apply a small  amount to port a cath site and cover with plastic wrap 1 hour prior to chemotherapy appointments    lisinopril-hydrochlorothiazide (ZESTORETIC) 10-12.5 MG tablet Take 1 tablet by mouth daily. 09/09/2019: On hold   LORazepam (ATIVAN) 0.5 MG tablet Take 0.5 mg by mouth at bedtime.    magic mouthwash w/lidocaine SOLN Take 5 mLs by mouth 4 (four) times daily as needed for mouth pain. Swish and spit.    meloxicam (MOBIC) 15 MG tablet Take 15 mg by mouth daily.     metoprolol tartrate (LOPRESSOR) 25 MG tablet Take 1 tablet (25 mg total) by mouth 2 (two) times daily.    Multiple Vitamin (MULTIVITAMIN WITH MINERALS) TABS tablet Take 1 tablet by mouth daily.    Omega-3 Fatty Acids (FISH OIL) 500 MG CAPS Take 1 capsule by mouth daily.     oxaliplatin in dextrose 5 % 500 mL Inject 85 mg/m2 into the vein once.    pantoprazole (PROTONIX) 40 MG tablet Take 40 mg by mouth daily.    sucralfate (CARAFATE) 1 g tablet Take 1 g by mouth 4 (four) times daily -  with meals and at bedtime.     acetaminophen (TYLENOL) 325 MG tablet Take 650 mg by mouth as needed for mild pain.  (Patient not taking: Reported on 09/16/2019)    ondansetron (ZOFRAN) 8 MG tablet Take 8 mg by mouth as needed for nausea or vomiting.  (Patient not taking: Reported on 09/16/2019)    prochlorperazine (COMPAZINE) 10 MG tablet Take 1 tablet (10 mg total) by mouth every 6 (six) hours as needed (Nausea or vomiting). (Patient not taking: Reported on 09/16/2019)    promethazine (PHENERGAN) 25 MG tablet Take 25 mg by mouth as needed for nausea or vomiting.  (Patient not taking: Reported on 09/16/2019)    No facility-administered encounter medications on file as of 09/16/2019.    ALLERGIES:  Allergies  Allergen Reactions   Advil [Ibuprofen] Other (See Comments)    Face and body numbness   Nyquil Multi-Symptom [Pseudoeph-Doxylamine-Dm-Apap] Other (See Comments)    Increase in bp    Sulfa Antibiotics  Nausea And Vomiting     PHYSICAL  EXAM:  ECOG Performance status: 1  Vitals:   09/16/19 0955  BP: (!) 89/61  Pulse: 80  Resp: 18  Temp: (!) 96.9 F (36.1 C)  SpO2: 95%   Filed Weights   09/16/19 0955  Weight: 198 lb 9.6 oz (90.1 kg)   Physical Exam Constitutional:      Appearance: Normal appearance. He is normal weight.  Cardiovascular:     Rate and Rhythm: Normal rate and regular rhythm.     Heart sounds: Normal heart sounds.  Pulmonary:     Breath sounds: Wheezing present.  Abdominal:     General: Bowel sounds are normal.     Palpations: Abdomen is soft.  Musculoskeletal:        General: Normal range of motion.  Skin:    General: Skin is warm.  Neurological:     Mental Status: He is alert and oriented to person, place, and time. Mental status is at baseline.  Psychiatric:        Mood and Affect: Mood normal.        Behavior: Behavior normal.        Thought Content: Thought content normal.        Judgment: Judgment normal.      LABORATORY DATA:  I have reviewed the labs as listed.  CBC    Component Value Date/Time   WBC 8.5 09/16/2019 0909   RBC 3.32 (L) 09/16/2019 0909   HGB 9.2 (L) 09/16/2019 0909   HCT 29.9 (L) 09/16/2019 0909   PLT 490 (H) 09/16/2019 0909   MCV 90.1 09/16/2019 0909   MCH 27.7 09/16/2019 0909   MCHC 30.8 09/16/2019 0909   RDW 17.8 (H) 09/16/2019 0909   LYMPHSABS 1.1 09/16/2019 0909   MONOABS 0.4 09/16/2019 0909   EOSABS 0.4 09/16/2019 0909   BASOSABS 0.0 09/16/2019 0909   CMP Latest Ref Rng & Units 09/16/2019 09/13/2019 09/08/2019  Glucose 70 - 99 mg/dL 124(H) 108(H) 100(H)  BUN 8 - 23 mg/dL _0 Creatinine 0.61 - 1.24 mg/dL 0.61 0.58(L) 0.70  Sodium 135 - 145 mmol/L 130(L) 132(L) 128(L)  Potassium 3.5 - 5.1 mmol/L 4.2 5.0 3.7  Chloride 98 - 111 mmol/L 95(L) 91(L) 91(L)  CO2 22 - 32 mmol/L _1 Calcium 8.9 - 10.3 mg/dL 8.3(L) 8.7(L) 8.2(L)  Total Protein 6.5 - 8.1 g/dL 6.6 7.1 7.0  Total Bilirubin 0.3 - 1.2 mg/dL 0.3 1.2 0.3  Alkaline Phos 38 - 126 U/L  136(H) 127(H) 153(H)  AST 15 - 41 U/L 40 52(H) 60(H)  ALT 0 - 44 U/L 26 30 37    All questions were answered to patient's stated satisfaction. Encouraged patient to call with any new concerns or questions before his next visit to the cancer center and we can certain see him sooner, if needed.     ASSESSMENT & PLAN:  Colon cancer metastasized to multiple sites ([place]) 1.  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  MMR is preserved. -PET scan on 07/27/2019 showed widespread hypermetabolic metastatic disease in the chest, abdomen and pelvis including mediastinal, right hilar, bilateral subpleural lung nodules, hepatic lesions, right adrenal uptake, abdominal adenopathy, omental nodularity and bone metastasis. -CEA is mildly elevated at 9.5. -He will be started on FOLFOX based chemotherapy.  He will start it initially lower dose.  He does not have  any history of strokes or DVTs.  Hence he is a candidate for bevacizumab.  However we would likely add Avastin during the second cycle.  No pre-existing neuropathy. -First cycle of FOLFOX was given on 09/09/2019 it was reduced by 20%. -He was in the hospital 3 days later with swelling of the face mouth and bilateral ankles.  He also has multiple mouth sores. -Labs checked on 09/16/2018 showed hemoglobin 9.2, WBC 8.5, platelets 490, albumin 2.2. -We will see him back in 1 week with his second cycle.  2.  Dizziness: -He reports of dizziness when standing up.  We have discontinued his benazepril and lisinopril/HCTZ and aspirin 81 mg at last visit. -He will also cut back on his metoprolol to 25 mg once daily. -Blood pressure today is 89/61.  His dizziness has improved however   3.  Constipation: -He is taking lactulose 30 mL at bedtime. -Constipation is not very well controlled. -Lactulose was increased to twice a  day.  4.  Upper and lower back pain: -He was taking hydrocodone 5/325 twice daily and was not very well controlled. -He is now taking hydrocodone 10/325 3 times a day as needed. -He reports his pain is under better control with this.  5.  Anxiety: -We have started him on Xanax 0.25 mg twice daily as needed.  6.  Family history: -Sister died of colon cancer at age 66 -Paternal grandfather had colon cancer. -We will consider genetic testing.     Orders placed this encounter:  Orders Placed This Encounter  Procedures   Lactate dehydrogenase   CEA   Magnesium   CBC with Differential/Platelet   Comprehensive metabolic panel      Francene Finders, FNP-C [place] [phone]

## 2019-09-16 NOTE — Progress Notes (Signed)
09/16/19  Received request to delete oxaliplatin from treatment plan due to ED visit with facial and lip swelling attributed to oxaliplatin at this time.  T.O. Francene Finders, NP/Taniya Dasher Ronnald Ramp, PharmD

## 2019-09-17 DIAGNOSIS — C189 Malignant neoplasm of colon, unspecified: Secondary | ICD-10-CM | POA: Diagnosis not present

## 2019-09-17 LAB — CEA: CEA: 15.9 ng/mL — ABNORMAL HIGH (ref 0.0–4.7)

## 2019-09-21 ENCOUNTER — Telehealth (HOSPITAL_COMMUNITY): Payer: Self-pay | Admitting: *Deleted

## 2019-09-21 ENCOUNTER — Encounter (HOSPITAL_COMMUNITY): Payer: Self-pay | Admitting: Hematology

## 2019-09-21 NOTE — Telephone Encounter (Signed)
Pt called clinic stating that he was still having constipation and that his kidneys weren't working good and that he still had mouth sores.   I returned the pt's call, when speaking with the pt he stated that he is still having trouble with bowel movements, he stated that he is taking the lactulose twice a day as well as a stool softener. He states that he also still has sores in his mouth and is using the "swish stuff" that we called in for him.   I spoke with [name] PA about above and he advised that the pt take Lactulose 8ml every 3 hours until he has a bowel movement as well as push his fluids, and continue taking the stool softener.   I also advised the pt to continue using the mouth wash to help with the sores.   The pt verbalized understanding.

## 2019-09-22 ENCOUNTER — Encounter (HOSPITAL_COMMUNITY): Payer: Self-pay

## 2019-09-22 ENCOUNTER — Other Ambulatory Visit: Payer: Self-pay

## 2019-09-22 ENCOUNTER — Encounter (HOSPITAL_COMMUNITY): Payer: Self-pay | Admitting: *Deleted

## 2019-09-22 ENCOUNTER — Inpatient Hospital Stay (HOSPITAL_COMMUNITY): Payer: Medicare Other

## 2019-09-22 ENCOUNTER — Other Ambulatory Visit (HOSPITAL_COMMUNITY): Payer: Self-pay | Admitting: Nurse Practitioner

## 2019-09-22 ENCOUNTER — Other Ambulatory Visit (HOSPITAL_COMMUNITY): Payer: Self-pay | Admitting: Hematology

## 2019-09-22 VITALS — BP 147/87 | HR 83 | Temp 97.0°F | Resp 18

## 2019-09-22 DIAGNOSIS — K1379 Other lesions of oral mucosa: Secondary | ICD-10-CM

## 2019-09-22 DIAGNOSIS — C7951 Secondary malignant neoplasm of bone: Secondary | ICD-10-CM | POA: Diagnosis not present

## 2019-09-22 DIAGNOSIS — R350 Frequency of micturition: Secondary | ICD-10-CM | POA: Diagnosis not present

## 2019-09-22 DIAGNOSIS — K5900 Constipation, unspecified: Secondary | ICD-10-CM | POA: Diagnosis not present

## 2019-09-22 DIAGNOSIS — Z452 Encounter for adjustment and management of vascular access device: Secondary | ICD-10-CM | POA: Diagnosis not present

## 2019-09-22 DIAGNOSIS — C189 Malignant neoplasm of colon, unspecified: Secondary | ICD-10-CM

## 2019-09-22 DIAGNOSIS — E860 Dehydration: Secondary | ICD-10-CM

## 2019-09-22 DIAGNOSIS — I2510 Atherosclerotic heart disease of native coronary artery without angina pectoris: Secondary | ICD-10-CM | POA: Diagnosis not present

## 2019-09-22 DIAGNOSIS — I10 Essential (primary) hypertension: Secondary | ICD-10-CM | POA: Diagnosis not present

## 2019-09-22 DIAGNOSIS — M545 Low back pain: Secondary | ICD-10-CM | POA: Diagnosis not present

## 2019-09-22 DIAGNOSIS — C182 Malignant neoplasm of ascending colon: Secondary | ICD-10-CM | POA: Diagnosis not present

## 2019-09-22 DIAGNOSIS — Z5111 Encounter for antineoplastic chemotherapy: Secondary | ICD-10-CM | POA: Diagnosis not present

## 2019-09-22 DIAGNOSIS — D649 Anemia, unspecified: Secondary | ICD-10-CM | POA: Diagnosis not present

## 2019-09-22 LAB — COMPREHENSIVE METABOLIC PANEL
ALT: 29 U/L (ref 0–44)
AST: 48 U/L — ABNORMAL HIGH (ref 15–41)
Albumin: 2.2 g/dL — ABNORMAL LOW (ref 3.5–5.0)
Alkaline Phosphatase: 201 U/L — ABNORMAL HIGH (ref 38–126)
Anion gap: 11 (ref 5–15)
BUN: 16 mg/dL (ref 8–23)
CO2: 25 mmol/L (ref 22–32)
Calcium: 8.3 mg/dL — ABNORMAL LOW (ref 8.9–10.3)
Chloride: 96 mmol/L — ABNORMAL LOW (ref 98–111)
Creatinine, Ser: 0.56 mg/dL — ABNORMAL LOW (ref 0.61–1.24)
GFR calc Af Amer: >60 mL/min (ref >60)
GFR calc non Af Amer: >60 mL/min (ref >60)
Glucose, Bld: 101 mg/dL — ABNORMAL HIGH (ref 70–99)
Potassium: 3.8 mmol/L (ref 3.5–5.1)
Sodium: 132 mmol/L — ABNORMAL LOW (ref 135–145)
Total Bilirubin: 0.4 mg/dL (ref 0.3–1.2)
Total Protein: 6.8 g/dL (ref 6.5–8.1)

## 2019-09-22 LAB — CBC WITH DIFFERENTIAL/PLATELET
Abs Immature Granulocytes: 0.02 10*3/uL (ref 0.00–0.07)
Basophils Absolute: 0 10*3/uL (ref 0.0–0.1)
Basophils Relative: 0 %
Eosinophils Absolute: 0.1 10*3/uL (ref 0.0–0.5)
Eosinophils Relative: 1 %
HCT: 28.2 % — ABNORMAL LOW (ref 39.0–52.0)
Hemoglobin: 8.7 g/dL — ABNORMAL LOW (ref 13.0–17.0)
Immature Granulocytes: 0 %
Lymphocytes Relative: 18 %
Lymphs Abs: 1.2 10*3/uL (ref 0.7–4.0)
MCH: 27.7 pg (ref 26.0–34.0)
MCHC: 30.9 g/dL (ref 30.0–36.0)
MCV: 89.8 fL (ref 80.0–100.0)
Monocytes Absolute: 0.5 10*3/uL (ref 0.1–1.0)
Monocytes Relative: 8 %
Neutro Abs: 5.1 10*3/uL (ref 1.7–7.7)
Neutrophils Relative %: 73 %
Platelets: 444 10*3/uL — ABNORMAL HIGH (ref 150–400)
RBC: 3.14 MIL/uL — ABNORMAL LOW (ref 4.22–5.81)
RDW: 19.1 % — ABNORMAL HIGH (ref 11.5–15.5)
WBC: 6.9 10*3/uL (ref 4.0–10.5)
nRBC: 0 % (ref 0.0–0.2)

## 2019-09-22 LAB — MAGNESIUM: Magnesium: 1.7 mg/dL (ref 1.7–2.4)

## 2019-09-22 LAB — LACTATE DEHYDROGENASE: LDH: 141 U/L (ref 98–192)

## 2019-09-22 MED ORDER — MAGNESIUM CITRATE PO SOLN: ORAL | 0 refills | Status: AC

## 2019-09-22 MED ORDER — SODIUM CHLORIDE 0.9 % IV SOLN
Freq: Once | INTRAVENOUS | Status: DC
  Filled 2019-09-22: qty 10

## 2019-09-22 MED ORDER — SODIUM CHLORIDE 0.9% FLUSH
10.0000 mL | INTRAVENOUS | Status: DC | PRN
  Administered 2019-09-22: 10 mL via INTRAVENOUS

## 2019-09-22 MED ORDER — SENNOSIDES-DOCUSATE SODIUM 8.6-50 MG PO TABS: 1.0000 | ORAL_TABLET | Freq: Two times a day (BID) | ORAL | 2 refills | Status: AC

## 2019-09-22 MED ORDER — SODIUM CHLORIDE 0.9 % IV SOLN
Freq: Once | INTRAVENOUS | Status: AC
  Filled 2019-09-22: qty 10

## 2019-09-22 MED ORDER — HEPARIN SOD (PORK) LOCK FLUSH 100 UNIT/ML IV SOLN
500.0000 [IU] | Freq: Once | INTRAVENOUS | Status: AC
  Administered 2019-09-22: 500 [IU] via INTRAVENOUS

## 2019-09-22 NOTE — Patient Instructions (Signed)
[facility] at [facility] Discharge Instructions  Labs drawn from portacath today   Thank you for choosing [facility] at [facility] to provide your oncology and hematology care.  To afford each patient quality time with our provider, please arrive at least 15 minutes before your scheduled appointment time.   If you have a lab appointment with the Cancer Center please come in thru the Main Entrance and check in at the main information desk.  You need to re-schedule your appointment should you arrive 10 or more minutes late.  We strive to give you quality time with our providers, and arriving late affects you and other patients whose appointments are after yours.  Also, if you no show three or more times for appointments you may be dismissed from the clinic at the providers discretion.     Again, thank you for choosing [facility].  Our hope is that these requests will decrease the amount of time that you wait before being seen by our physicians.       _____________________________________________________________  Should you have questions after your visit to [facility], please contact our office at [phone] between the hours of 8:00 a.m. and 4:30 p.m.  Voicemails left after 4:00 p.m. will not be returned until the following business day.  For prescription refill requests, have your pharmacy contact our office and allow 72 hours.    Due to Covid, you will need to wear a mask upon entering the hospital. If you do not have a mask, a mask will be given to you at the Main Entrance upon arrival. For doctor visits, patients may have 1 support person with them. For treatment visits, patients can not have anyone with them due to social distancing guidelines and our immunocompromised population.     

## 2019-09-22 NOTE — Progress Notes (Signed)
Patient seen by [name], PA, with verbal order hydration tomorrow and no treatment. Reviewed with the family prescriptions sent to the pharmacy for constipation with understanding verbalized.  No s/s of distress noted.    Patient asking to stop fluids early so he can get to the drug store and go home to rest.    Patient tolerated hydration with no complaints voiced.  Port site clean and dry with good blood return noted before and after hydration.  No bruising or swelling noted with port.  Dressing intact.   VSS with discharge and left by wheelchair with no s/s of distress noted.

## 2019-09-22 NOTE — Patient Instructions (Signed)
[place] at [facility] Discharge Instructions  Received IV hydration with magnesium and potassium today. Follow-up as scheduled   Thank you for choosing [place] at [facility] to provide your oncology and hematology care.  To afford each patient quality time with our provider, please arrive at least 15 minutes before your scheduled appointment time.   If you have a lab appointment with the [facility] please come in thru the Main Entrance and check in at the main information desk.  You need to re-schedule your appointment should you arrive 10 or more minutes late.  We strive to give you quality time with our providers, and arriving late affects you and other patients whose appointments are after yours.  Also, if you no show three or more times for appointments you may be dismissed from the clinic at the providers discretion.     Again, thank you for choosing [facility].  Our hope is that these requests will decrease the amount of time that you wait before being seen by our physicians.       _____________________________________________________________  Should you have questions after your visit to [facility], please contact our office at [phone] between the hours of 8:00 a.m. and 4:30 p.m.  Voicemails left after 4:00 p.m. will not be returned until the following business day.  For prescription refill requests, have your pharmacy contact our office and allow 72 hours.    Due to Covid, you will need to wear a mask upon entering the hospital. If you do not have a mask, a mask will be given to you at the Main Entrance upon arrival. For doctor visits, patients may have 1 support person with them. For treatment visits, patients can not have anyone with them due to social distancing guidelines and our immunocompromised population.

## 2019-09-22 NOTE — Progress Notes (Signed)
Patient called stating that he still was not feeling well.  He is taking lactulose every 3 hours and was up all night feeling like he had to go to the restroom but couldn't pass more than just a tiny bit of stool.  He feels like when he urinates, he doesn't completely empty his bladder.  He reports mouth sores still and is using his mouth wash but it only temporarily relieves the pain.  His feet and ankles are swollen.    I spoke with Francene Finders, NP and she wants patient to come in today for labs and fluids and then keep his appt tomorrow as scheduled.   Patient is aware.

## 2019-09-23 ENCOUNTER — Other Ambulatory Visit (HOSPITAL_COMMUNITY): Payer: Medicare Other

## 2019-09-23 ENCOUNTER — Inpatient Hospital Stay (HOSPITAL_BASED_OUTPATIENT_CLINIC_OR_DEPARTMENT_OTHER): Payer: Medicare Other | Admitting: Oncology

## 2019-09-23 ENCOUNTER — Inpatient Hospital Stay (HOSPITAL_COMMUNITY): Payer: Medicare Other

## 2019-09-23 VITALS — BP 101/59 | HR 51 | Temp 97.9°F | Resp 18

## 2019-09-23 VITALS — BP 105/64 | HR 61 | Temp 97.6°F | Resp 18 | Wt 194.4 lb

## 2019-09-23 DIAGNOSIS — I10 Essential (primary) hypertension: Secondary | ICD-10-CM | POA: Diagnosis not present

## 2019-09-23 DIAGNOSIS — Z5111 Encounter for antineoplastic chemotherapy: Secondary | ICD-10-CM | POA: Diagnosis not present

## 2019-09-23 DIAGNOSIS — R350 Frequency of micturition: Secondary | ICD-10-CM | POA: Diagnosis not present

## 2019-09-23 DIAGNOSIS — R3915 Urgency of urination: Secondary | ICD-10-CM

## 2019-09-23 DIAGNOSIS — Z452 Encounter for adjustment and management of vascular access device: Secondary | ICD-10-CM | POA: Diagnosis not present

## 2019-09-23 DIAGNOSIS — R531 Weakness: Secondary | ICD-10-CM | POA: Diagnosis not present

## 2019-09-23 DIAGNOSIS — C189 Malignant neoplasm of colon, unspecified: Secondary | ICD-10-CM

## 2019-09-23 DIAGNOSIS — C182 Malignant neoplasm of ascending colon: Secondary | ICD-10-CM | POA: Diagnosis not present

## 2019-09-23 DIAGNOSIS — I2510 Atherosclerotic heart disease of native coronary artery without angina pectoris: Secondary | ICD-10-CM | POA: Diagnosis not present

## 2019-09-23 DIAGNOSIS — K5900 Constipation, unspecified: Secondary | ICD-10-CM | POA: Diagnosis not present

## 2019-09-23 DIAGNOSIS — K1379 Other lesions of oral mucosa: Secondary | ICD-10-CM

## 2019-09-23 DIAGNOSIS — Z95828 Presence of other vascular implants and grafts: Secondary | ICD-10-CM | POA: Diagnosis not present

## 2019-09-23 DIAGNOSIS — D649 Anemia, unspecified: Secondary | ICD-10-CM | POA: Diagnosis not present

## 2019-09-23 DIAGNOSIS — C7951 Secondary malignant neoplasm of bone: Secondary | ICD-10-CM | POA: Diagnosis not present

## 2019-09-23 DIAGNOSIS — M545 Low back pain: Secondary | ICD-10-CM | POA: Diagnosis not present

## 2019-09-23 DIAGNOSIS — E860 Dehydration: Secondary | ICD-10-CM

## 2019-09-23 LAB — CEA: CEA: 16.6 ng/mL — ABNORMAL HIGH (ref 0.0–4.7)

## 2019-09-23 MED ORDER — HEPARIN SOD (PORK) LOCK FLUSH 100 UNIT/ML IV SOLN
500.0000 [IU] | Freq: Once | INTRAVENOUS | Status: AC
  Administered 2019-09-23: 500 [IU] via INTRAVENOUS

## 2019-09-23 MED ORDER — SODIUM CHLORIDE 0.9 % IV SOLN
Freq: Once | INTRAVENOUS | Status: AC
  Filled 2019-09-23: qty 10

## 2019-09-23 MED ORDER — SODIUM CHLORIDE 0.9% FLUSH
10.0000 mL | Freq: Once | INTRAVENOUS | Status: AC
  Administered 2019-09-23: 10 mL via INTRAVENOUS

## 2019-09-23 NOTE — Progress Notes (Signed)
Celene Squibb, MD [address] [place] [address]  Colon cancer metastasized to multiple sites [facility]) - Plan: Urinalysis, dipstick only, Iron and TIBC, Ferritin, Vitamin B12, Folate, Methylmalonic acid, serum, Methylmalonic acid, serum, Folate, Vitamin B12, Ferritin, Iron and TIBC  Port-A-Cath in place  Mouth sore  Weakness   HISTORY OF PRESENT ILLNESS: senakot S 2 tabs BID Mag citrate at home, 1/2 bottle, reapeat 2 hours later if no BM.   CURRENT STATUS: Richard Conway 74 y.o. male returns for followup of in follow-up of metastatic CRC, undergoing palliative systemic chemotherapy.  His mouth sores are improved clinically and symptomatically and nearly resolved.  I have a low suspicion for HSV, but this was checked yesterday and is in process.  No new lumps or bumps on his own examination.  He admits to a cough that is productive of clear sputum.  No fevers or chills.  He denies any new pain.  He admits to urinary frequency and I think that is secondary to IVF he has been receiving.  Nevertheless, we will check a UA and culture to rule out infection if he uses the bathroom today while in the clinic.  He continues to suffer from constipation and he admits to having started the bowel regimen recommended to him yesterday when he was seen briefly as a work-in.  Review of Systems  Constitutional: Positive for malaise/fatigue. Negative for chills, fever and weight loss.  HENT:       Mouth sores- improved  Eyes: Negative.   Respiratory: Negative.  Negative for cough.   Cardiovascular: Negative.  Negative for chest pain.  Gastrointestinal: Negative.  Negative for blood in stool, constipation, diarrhea, melena, nausea and vomiting.  Genitourinary: Positive for frequency and urgency.  Musculoskeletal: Negative.   Skin: Negative.   Neurological: Negative.  Negative for weakness.  Endo/Heme/Allergies: Negative.   Psychiatric/Behavioral: Negative.     Past Medical  History:  Diagnosis Date  . Arthritis   . CAD (coronary artery disease)   . Cancer ([place])   . Hypertension   . Port-A-Cath in place 09/08/2019  . Renal disorder    kidney stones     PHYSICAL EXAMINATION  ECOG PERFORMANCE STATUS: 1 - Symptomatic but completely ambulatory  Vitals:   09/23/19 0953 09/23/19 0955  BP: 105/64 105/64  Pulse: 61 61  Resp: 18 18  Temp: 97.6 F (36.4 C) 97.6 F (36.4 C)  SpO2: 97% 97%    GENERAL:alert, no distress, well nourished, well developed, comfortable, cooperative and accompanied by daughter and wife. SKIN: skin color, texture, turgor are normal, no rashes or significant lesions HEAD: Normocephalic, No masses, lesions, tenderness or abnormalities EYES: normal, Conjunctiva are pink and non-injected EARS: External ears normal OROPHARYNX:no exudate, no erythema, mucous membranes are moist and minimal erythema involving the most superior aspect of lower lips.  NECK: supple, trachea midline LYMPH:  no palpable lymphadenopathy BREAST:not examined LUNGS: clear to auscultation  HEART: regular rate & rhythm ABDOMEN:abdomen soft BACK: Back symmetric, no curvature. EXTREMITIES:less then 2 second capillary refill, positive findings:  edema 1+ pitting edema B/L  NEURO: alert & oriented x 3 with fluent speech, no focal motor/sensory deficits   LABORATORY DATA: CBC    Component Value Date/Time   WBC 6.9 09/22/2019 1347   RBC 3.14 (L) 09/22/2019 1347   HGB 8.7 (L) 09/22/2019 1347   HCT 28.2 (L) 09/22/2019 1347   PLT 444 (H) 09/22/2019 1347   MCV 89.8 09/22/2019  [place] 27.7 09/22/2019 1347   MCHC 30.9 09/22/2019 1347   RDW 19.1 (H) 09/22/2019 1347   LYMPHSABS 1.2 09/22/2019 1347   MONOABS 0.5 09/22/2019 1347   EOSABS 0.1 09/22/2019 1347   BASOSABS 0.0 09/22/2019 1347      Chemistry      Component Value Date/Time   NA 132 (L) 09/22/2019 1347   K 3.8 09/22/2019 1347   CL 96 (L) 09/22/2019 1347   CO2 25 09/22/2019 1347   BUN 16  09/22/2019 1347   CREATININE 0.56 (L) 09/22/2019 1347      Component Value Date/Time   CALCIUM 8.3 (L) 09/22/2019 1347   ALKPHOS 201 (H) 09/22/2019 1347   AST 48 (H) 09/22/2019 1347   ALT 29 09/22/2019 1347   BILITOT 0.4 09/22/2019 1347       RADIOGRAPHIC STUDIES:  DG Chest Port 1 View  Result Date: 09/13/2019 CLINICAL DATA:  Lip swelling and leg swelling since yesterday, history of MI EXAM: PORTABLE CHEST 1 VIEW COMPARISON:  June 15, 2019 and study of 08/27/2019 FINDINGS: RIGHT IJ Port-A-Cath terminating at the distal aspect of the superior vena cava. Opacification in the LEFT hemithorax with blunting of costodiaphragmatic sulcus and complete obscuration of LEFT hemidiaphragm. Heart size is likely stable but partially obscured as well. Increased interstitial markings in the RIGHT chest. Nodular densities projecting over the RIGHT peripheral chest, of uncertain significance not accounted for on prior CT or PET imaging definitively but with pleural nodularity on the prior PET that would have to be more conspicuous and or enlarged to be visible on the current x-ray. Visualized skeletal structures on limited assessment without acute finding. IMPRESSION: 1. LEFT pleural effusion and basilar airspace disease along with generalized increased interstitial markings. Findings may be related to heart failure. Airspace disease at the LEFT lung base remains nonspecific. 2. Increasing nodularity along the peripheral RIGHT chest may represent worsening pleural disease which is an alternative explanation for pleural effusion as well. Electronically Signed   By: Zetta Bills M.D.   On: 09/13/2019 16:16   IR IMAGING GUIDED PORT INSERTION  Result Date: 08/27/2019 INDICATION: History of metastatic colon cancer. In need of durable intravenous access for administration of chemotherapy. EXAM: IMPLANTED PORT A CATH PLACEMENT WITH ULTRASOUND AND FLUOROSCOPIC GUIDANCE COMPARISON:  None. MEDICATIONS: Ancef 2 gm IV;  The antibiotic was administered within an appropriate time interval prior to skin puncture. ANESTHESIA/SEDATION: Moderate (conscious) sedation was employed during this procedure. A total of Versed 1 mg and Fentanyl 50 mcg was administered intravenously. Moderate Sedation Time: 26 minutes. The patient's level of consciousness and vital signs were monitored continuously by radiology nursing throughout the procedure under my direct supervision. CONTRAST:  None FLUOROSCOPY TIME:  18 seconds (16 mGy) COMPLICATIONS: None immediate. PROCEDURE: The procedure, risks, benefits, and alternatives were explained to the patient. Questions regarding the procedure were encouraged and answered. The patient understands and consents to the procedure. The right neck and chest were prepped with chlorhexidine in a sterile fashion, and a sterile drape was applied covering the operative field. Maximum barrier sterile technique with sterile gowns and gloves were used for the procedure. A timeout was performed prior to the initiation of the procedure. Local anesthesia was provided with 1% lidocaine with epinephrine. After creating a small venotomy incision, a micropuncture kit was utilized to access the internal jugular vein. Real-time ultrasound guidance was utilized for vascular access including the acquisition of a permanent ultrasound image documenting patency of the accessed vessel. The  microwire was utilized to measure appropriate catheter length. A subcutaneous port pocket was then created along the upper chest wall utilizing a combination of sharp and blunt dissection. The pocket was irrigated with sterile saline. A single lumen "ISP" sized power injectable port was chosen for placement. The 8 Fr catheter was tunneled from the port pocket site to the venotomy incision. The port was placed in the pocket. The external catheter was trimmed to appropriate length. At the venotomy, an 8 Fr peel-away sheath was placed over a guidewire under  fluoroscopic guidance. The catheter was then placed through the sheath and the sheath was removed. Final catheter positioning was confirmed and documented with a fluoroscopic spot radiograph. The port was accessed with a Huber needle, aspirated and flushed with heparinized saline. The venotomy site was closed with an interrupted 4-0 Vicryl suture. The port pocket incision was closed with interrupted 2-0 Vicryl suture. The skin was opposed with a running subcuticular 4-0 Vicryl suture. Dermabond and Steri-strips were applied to both incisions. Dressings were applied. The patient tolerated the procedure well without immediate post procedural complication. FINDINGS: After catheter placement, the tip lies within the superior cavoatrial junction. The catheter aspirates and flushes normally and is ready for immediate use. IMPRESSION: Successful placement of a right internal jugular approach power injectable Port-A-Cath. The catheter is ready for immediate use. Electronically Signed   By: Sandi Mariscal M.D.   On: 08/27/2019 15:40       ASSESSMENT AND PLAN:  1. Colon cancer metastasized to multiple sites [facility]) Stage IV colon cancer, biopsy-proven on diagnostic colonoscopy revealing an ascending colon "apple core lesion" revealing adenocarcinoma with preserved MMR.  Staging PET scan on 07/27/2019 demonstrated widespread metastatic disease involving the lung, abdomen, and pelvis.  CEA only minimally elevated at 9.5 at baseline.  He started palliative FOLFOX chemotherapy beginning on 09/09/2019 beginning on a reduced dose.  Plan is to add Avastin in the future.  He was seen for a short work-in appointment yesterday on 09/22/2019 for mouth sores.  He was administered IV fluids yesterday.  He is taking Dukes mouthwash with minimal improvement.  I added a HSV by PCR labs yesterday since he had sores on his mouth.  He also complained of constipation and therefore I started the following bowel regimen: Senokot-S 2 tabs BID and  MiraLax 1 capful BID.  If not BM by Friday AM, will recommend Mag Citrate (take 1/2 bottle and if no BM within 2 hours, finish the remaining 1/2 of bottle).  I have asked him to consume 1/2 cup of raisins daily and 1 -2 glasses of prune juice.  He reports urinary frequency and urgency, likely secondary to IVF.  Will check a UA and urine culture today to rule out UTI.  He does not feel like he is tolerating chemotherapy well thus far and I agree.  Since treatment is being given with palliative intent, will DEFER chemotherapy today x 1 week.  Will focus on symptom control at this time.  I will discontinue Oxaliplatin today due to poor tolerance and Avastin was added for cycle#2.  Oxaliplatin can always be added back in the future, but since treatment is palliative in nature only, I would like to see if we can get a few treatments completed prior to restaging scans followed by re-formulation of medical oncology treatment plan.  I am hopeful he will not refuse future treatments.  Laboratory work completed yesterday reveals a progressive anemia of 8.7 g/dL, normocytic, with minimally elevated platelet count  444,000 and normal white blood cell count.  Metabolic panel stable with indication of dehydration with a WKH:IPRKSYSDBN ratio > 20.  He received 1 L IVF yesterday and again today.  Albumin significantly low at 2.2 and mild hypocalcemia but when corrected for low albumin is within normal limits.  Increasing alkaline phosphatase at 201 and AST minimally elevated at 48.  Rising CEA at 16.6.  With his declining Hgb, will check additional labs: iron/TIBC, ferritin, B12, MMA, and folate to rule out vitamin deficiencies.  This may be secondary to hemodilution +/- chemotherapy.  Return in 1 week for follow-up and consideration of chemotherapy with the elimination of Oxaliplatin but the addition of Avastin.  He will need UA to evaluate for proteinuria prior to Avastin administration.  Orders placed  accordingly.   2. Port-A-Cath in place Note- accessed today for IVF  3. Mouth sore Improved  4. Weakness Improved, nit yet at baseline.   ORDERS PLACED FOR THIS ENCOUNTER: Orders Placed This Encounter  Procedures  . Urinalysis, dipstick only  . Iron and TIBC  . Ferritin  . Vitamin B12  . Folate  . Methylmalonic acid, serum    MEDICATIONS PRESCRIBED THIS ENCOUNTER: No orders of the defined types were placed in this encounter.   All questions were answered. The patient knows to call the clinic with any problems, questions or concerns. We can certainly see the patient much sooner if necessary.  Patient and plan discussed with Dr. Derek Jack and he is in agreement with the aforementioned.   This note is electronically signed by: Robynn Pane, PA-C 09/23/2019 10:19 AM

## 2019-09-23 NOTE — Progress Notes (Signed)
Patient has been assessed, vital signs and labs have been reviewed by [name] PA. HOLD treatment today, please give 1 liter of fluids with electrolytes over 2 hours today per [name] PA. Pt will return in 1 week with labs and treatment. Oxaliplatin discontinued and Avastin added for cycle 2 per [name] PA.

## 2019-09-23 NOTE — Patient Instructions (Signed)
[place] at [facility] Discharge Instructions  You were seen today by [name] PA. He went over how you're feeling, you will be given fluids today. He will see you back in 1 week for labs and follow up.   Thank you for choosing [place] at [facility] to provide your oncology and hematology care.  To afford each patient quality time with our provider, please arrive at least 15 minutes before your scheduled appointment time.   If you have a lab appointment with the [place] please come in thru the  Main Entrance and check in at the main information desk  You need to re-schedule your appointment should you arrive 10 or more minutes late.  We strive to give you quality time with our providers, and arriving late affects you and other patients whose appointments are after yours.  Also, if you no show three or more times for appointments you may be dismissed from the clinic at the providers discretion.     Again, thank you for choosing [facility].  Our hope is that these requests will decrease the amount of time that you wait before being seen by our physicians.       _____________________________________________________________  Should you have questions after your visit to [facility], please contact our office at [phone] between the hours of 8:00 a.m. and 4:30 p.m.  Voicemails left after 4:00 p.m. will not be returned until the following business day.  For prescription refill requests, have your pharmacy contact our office and allow 72 hours.    Cancer Center Support Programs:   > Cancer Support Group  2nd Tuesday of the month 1pm-2pm, Journey Room

## 2019-09-23 NOTE — Progress Notes (Signed)
Patient unable to void for urine and culture.  [name], PA, notified.  Lab notified.    Patient tolerated hydration with no complaints voiced.  Port site clean and dry with good blood return noted before and after hydration.  No bruising or swelling noted with port.  Band aid applied.  VSS with discharge and left by wheelchair with no s/s of distress noted.

## 2019-09-25 ENCOUNTER — Encounter (HOSPITAL_COMMUNITY): Payer: Medicare Other

## 2019-09-25 ENCOUNTER — Ambulatory Visit (HOSPITAL_COMMUNITY): Payer: Medicare Other

## 2019-09-25 LAB — HSV 1 AND 2 IGM ABS, INDIRECT
HSV 1 IgM Antibodies: <1:10 {titer}
HSV 2 IgM Antibodies: <1:10 {titer}

## 2019-09-25 NOTE — Progress Notes (Signed)
Nutrition Follow-up:  Patient with metastatic colon cancer.  Patient receiving folfox and bevacizumab.    Spoke with patient via phone.  Patient reports appetite might be a little bit better.  Mouth sores are better.  Still working to have bowel movement.  Taking bowel regimen as prescribed.  Has eaten butter biscuit so far today and drinking water and dr pepper.      Medications: reviewed  Labs: Na 132  Anthropometrics:   Weight 194 lb 6.4 oz increased from 191 lb on 6/11.     NUTRITION DIAGNOSIS: Inadequate oral intake continues   INTERVENTION:  Patient to drink ensure before supper tonight for additional calories and protein Discussed soft, moist, easy to chew foods with mouth sores.   Patient has contact information    MONITORING, EVALUATION, GOAL: weight trends, intake   NEXT VISIT: July 23 phone f/u  Brittay Mogle B. Zenia Resides, [place], [place] Registered Dietitian [phone] (pager)

## 2019-09-28 ENCOUNTER — Other Ambulatory Visit (HOSPITAL_COMMUNITY): Payer: Self-pay | Admitting: Nurse Practitioner

## 2019-09-28 ENCOUNTER — Telehealth (HOSPITAL_COMMUNITY): Payer: Self-pay

## 2019-09-28 NOTE — Telephone Encounter (Signed)
Patient called and stated that we would not be in for chemotherapy treatments tomorrow due to severe diarrhea.  He informed me that he was having bowel incontinence and feeling very unwell.  He also stated that he had shortness of breath.  I spoke with [name], PA and after reviewing previous notes, found that he was c/o constipation the previous week and was taking several stool softeners.  I advised patient to stop Senna, Miralax, and mag citrate.  I also advised him to use Imodium per standing order protocol per [name].

## 2019-09-29 ENCOUNTER — Ambulatory Visit (HOSPITAL_COMMUNITY): Payer: Medicare Other | Admitting: Oncology

## 2019-09-29 ENCOUNTER — Ambulatory Visit (HOSPITAL_COMMUNITY): Payer: Medicare Other

## 2019-09-29 ENCOUNTER — Telehealth: Payer: Self-pay | Admitting: Cardiovascular Disease

## 2019-09-29 ENCOUNTER — Other Ambulatory Visit (HOSPITAL_COMMUNITY): Payer: Medicare Other

## 2019-09-29 DIAGNOSIS — C771 Secondary and unspecified malignant neoplasm of intrathoracic lymph nodes: Secondary | ICD-10-CM | POA: Diagnosis not present

## 2019-09-29 DIAGNOSIS — R110 Nausea: Secondary | ICD-10-CM | POA: Diagnosis not present

## 2019-09-29 DIAGNOSIS — R918 Other nonspecific abnormal finding of lung field: Secondary | ICD-10-CM | POA: Diagnosis not present

## 2019-09-29 DIAGNOSIS — D508 Other iron deficiency anemias: Secondary | ICD-10-CM | POA: Diagnosis not present

## 2019-09-29 NOTE — Telephone Encounter (Signed)
Patient called and stated he fell yesterday, prior to falling he experienced some shortness of breath, and felt like his heart was beating fast. He's also had some dizzy spells. He would like for a nurse to give him a call [phone]. He has an appt at 2pm today, if he doesn't answer you can leave a message

## 2019-09-30 ENCOUNTER — Observation Stay (HOSPITAL_COMMUNITY)
Admission: EM | Admit: 2019-09-30 | Discharge: 2019-10-01 | Disposition: A | Discharge status: Home / Self Care | Payer: Medicare Other | Attending: Internal Medicine | Admitting: Internal Medicine

## 2019-09-30 ENCOUNTER — Emergency Department (HOSPITAL_COMMUNITY): Payer: Medicare Other

## 2019-09-30 ENCOUNTER — Telehealth: Payer: Self-pay | Admitting: Family Medicine

## 2019-09-30 ENCOUNTER — Other Ambulatory Visit: Payer: Self-pay

## 2019-09-30 DIAGNOSIS — Z825 Family history of asthma and other chronic lower respiratory diseases: Secondary | ICD-10-CM | POA: Diagnosis not present

## 2019-09-30 DIAGNOSIS — R0602 Shortness of breath: Secondary | ICD-10-CM

## 2019-09-30 DIAGNOSIS — Z888 Allergy status to other drugs, medicaments and biological substances status: Secondary | ICD-10-CM | POA: Diagnosis not present

## 2019-09-30 DIAGNOSIS — Z20822 Contact with and (suspected) exposure to covid-19: Secondary | ICD-10-CM | POA: Diagnosis not present

## 2019-09-30 DIAGNOSIS — E785 Hyperlipidemia, unspecified: Secondary | ICD-10-CM | POA: Insufficient documentation

## 2019-09-30 DIAGNOSIS — J9811 Atelectasis: Secondary | ICD-10-CM | POA: Diagnosis not present

## 2019-09-30 DIAGNOSIS — Z886 Allergy status to analgesic agent status: Secondary | ICD-10-CM | POA: Diagnosis not present

## 2019-09-30 DIAGNOSIS — C189 Malignant neoplasm of colon, unspecified: Secondary | ICD-10-CM | POA: Diagnosis not present

## 2019-09-30 DIAGNOSIS — Z79899 Other long term (current) drug therapy: Secondary | ICD-10-CM | POA: Insufficient documentation

## 2019-09-30 DIAGNOSIS — J9601 Acute respiratory failure with hypoxia: Principal | ICD-10-CM | POA: Insufficient documentation

## 2019-09-30 DIAGNOSIS — C7889 Secondary malignant neoplasm of other digestive organs: Secondary | ICD-10-CM | POA: Diagnosis not present

## 2019-09-30 DIAGNOSIS — Z8349 Family history of other endocrine, nutritional and metabolic diseases: Secondary | ICD-10-CM | POA: Diagnosis not present

## 2019-09-30 DIAGNOSIS — Z8249 Family history of ischemic heart disease and other diseases of the circulatory system: Secondary | ICD-10-CM | POA: Insufficient documentation

## 2019-09-30 DIAGNOSIS — C799 Secondary malignant neoplasm of unspecified site: Secondary | ICD-10-CM | POA: Diagnosis present

## 2019-09-30 DIAGNOSIS — Z87891 Personal history of nicotine dependence: Secondary | ICD-10-CM | POA: Insufficient documentation

## 2019-09-30 DIAGNOSIS — Z800 Family history of malignant neoplasm of digestive organs: Secondary | ICD-10-CM | POA: Diagnosis not present

## 2019-09-30 DIAGNOSIS — Z87442 Personal history of urinary calculi: Secondary | ICD-10-CM | POA: Insufficient documentation

## 2019-09-30 DIAGNOSIS — I2510 Atherosclerotic heart disease of native coronary artery without angina pectoris: Secondary | ICD-10-CM | POA: Diagnosis not present

## 2019-09-30 DIAGNOSIS — I10 Essential (primary) hypertension: Secondary | ICD-10-CM | POA: Diagnosis not present

## 2019-09-30 DIAGNOSIS — R42 Dizziness and giddiness: Secondary | ICD-10-CM | POA: Insufficient documentation

## 2019-09-30 DIAGNOSIS — Z833 Family history of diabetes mellitus: Secondary | ICD-10-CM | POA: Insufficient documentation

## 2019-09-30 DIAGNOSIS — Z823 Family history of stroke: Secondary | ICD-10-CM | POA: Insufficient documentation

## 2019-09-30 DIAGNOSIS — Z882 Allergy status to sulfonamides status: Secondary | ICD-10-CM | POA: Diagnosis not present

## 2019-09-30 DIAGNOSIS — J90 Pleural effusion, not elsewhere classified: Secondary | ICD-10-CM

## 2019-09-30 DIAGNOSIS — Z9889 Other specified postprocedural states: Secondary | ICD-10-CM

## 2019-09-30 DIAGNOSIS — Z791 Long term (current) use of non-steroidal anti-inflammatories (NSAID): Secondary | ICD-10-CM | POA: Diagnosis not present

## 2019-09-30 DIAGNOSIS — F419 Anxiety disorder, unspecified: Secondary | ICD-10-CM | POA: Diagnosis not present

## 2019-09-30 LAB — CBC WITH DIFFERENTIAL/PLATELET
Abs Immature Granulocytes: 0.08 10*3/uL — ABNORMAL HIGH (ref 0.00–0.07)
Basophils Absolute: 0 10*3/uL (ref 0.0–0.1)
Basophils Relative: 0 %
Eosinophils Absolute: 0.1 10*3/uL (ref 0.0–0.5)
Eosinophils Relative: 1 %
HCT: 33.1 % — ABNORMAL LOW (ref 39.0–52.0)
Hemoglobin: 10.2 g/dL — ABNORMAL LOW (ref 13.0–17.0)
Immature Granulocytes: 1 %
Lymphocytes Relative: 21 %
Lymphs Abs: 1.9 10*3/uL (ref 0.7–4.0)
MCH: 28.3 pg (ref 26.0–34.0)
MCHC: 30.8 g/dL (ref 30.0–36.0)
MCV: 91.7 fL (ref 80.0–100.0)
Monocytes Absolute: 1 10*3/uL (ref 0.1–1.0)
Monocytes Relative: 11 %
Neutro Abs: 6 10*3/uL (ref 1.7–7.7)
Neutrophils Relative %: 66 %
Platelets: 621 10*3/uL — ABNORMAL HIGH (ref 150–400)
RBC: 3.61 MIL/uL — ABNORMAL LOW (ref 4.22–5.81)
RDW: 20.5 % — ABNORMAL HIGH (ref 11.5–15.5)
WBC: 9.1 10*3/uL (ref 4.0–10.5)
nRBC: 0 % (ref 0.0–0.2)

## 2019-09-30 LAB — BASIC METABOLIC PANEL
Anion gap: 12 (ref 5–15)
BUN: 21 mg/dL (ref 8–23)
CO2: 24 mmol/L (ref 22–32)
Calcium: 8.5 mg/dL — ABNORMAL LOW (ref 8.9–10.3)
Chloride: 97 mmol/L — ABNORMAL LOW (ref 98–111)
Creatinine, Ser: 0.65 mg/dL (ref 0.61–1.24)
GFR calc Af Amer: >60 mL/min (ref >60)
GFR calc non Af Amer: >60 mL/min (ref >60)
Glucose, Bld: 106 mg/dL — ABNORMAL HIGH (ref 70–99)
Potassium: 3.3 mmol/L — ABNORMAL LOW (ref 3.5–5.1)
Sodium: 133 mmol/L — ABNORMAL LOW (ref 135–145)

## 2019-09-30 LAB — TROPONIN I (HIGH SENSITIVITY)
Troponin I (High Sensitivity): 5 ng/L (ref <18)
Troponin I (High Sensitivity): 5 ng/L (ref <18)

## 2019-09-30 LAB — SARS CORONAVIRUS 2 BY RT PCR (HOSPITAL ORDER, PERFORMED IN ~~LOC~~ HOSPITAL LAB): SARS Coronavirus 2: NEGATIVE

## 2019-09-30 LAB — TYPE AND SCREEN
ABO/RH(D): A POS
Antibody Screen: NEGATIVE

## 2019-09-30 LAB — BRAIN NATRIURETIC PEPTIDE: B Natriuretic Peptide: 142 pg/mL — ABNORMAL HIGH (ref 0.0–100.0)

## 2019-09-30 MED ORDER — FUROSEMIDE 10 MG/ML IJ SOLN
40.0000 mg | Freq: Once | INTRAMUSCULAR | Status: AC
  Administered 2019-09-30: 40 mg via INTRAVENOUS
  Filled 2019-09-30: qty 4

## 2019-09-30 MED ORDER — HYDROCODONE-ACETAMINOPHEN 5-325 MG PO TABS
1.0000 | ORAL_TABLET | Freq: Once | ORAL | Status: AC
  Administered 2019-09-30: 1 via ORAL
  Filled 2019-09-30: qty 1

## 2019-09-30 NOTE — ED Notes (Signed)
Went in to ambulate patient, patient needed restroom. Patient assisted to toilet in room. Patient 02 sat dropped to 86 percent room air, respirations 30, heart rate 106. Assisted patient back to bed. Patient 02 sat 96, but patient still had high respirations. EDP made aware.

## 2019-09-30 NOTE — ED Notes (Signed)
Patient states that his back is hurting at this time. States that he had bad arthritis and can not get comfortable.  Patient given warm blanket. Patient and wife wanting an update. Let them know I will speak with the nurse.

## 2019-09-30 NOTE — ED Provider Notes (Signed)
[facility] EMERGENCY DEPARTMENT Provider Note   CSN: [id number] Arrival date & time: 09/30/19  1751     History Chief Complaint  Patient presents with  . Shortness of Breath    Richard Conway is a 74 y.o. male.  Pt presents to the ED today with sob.  Pt said it's been going on for 2 months, but it worsened in the last week and today is the worse.  Pt said he also feels more weak than nl.  Pt denies any cp.  No fever.  He's not been vaccinated against Covid.  Pt does have a hx of metastatic colon cancer that was diagnosed in May.  He has started chemotherapy (FOLFAX) recently.  Pt has been having some dizzy spells and has gotten some fluids at the cancer center.            Past Medical History:  Diagnosis Date  . Arthritis   . CAD (coronary artery disease)   . Cancer ([place])   . Hypertension   . Port-A-Cath in place 09/08/2019  . Renal disorder    kidney stones    Patient Active Problem List   Diagnosis Date Noted  . Port-A-Cath in place 09/08/2019  . Goals of care, counseling/discussion 09/02/2019  . Colon cancer metastasized to multiple sites ([place]) 08/24/2019  . Abnormal PET scan of colon 08/12/2019  . Metastatic cancer ([place]) 07/29/2019    Past Surgical History:  Procedure Laterality Date  . BIOPSY  08/18/2019   Procedure: BIOPSY;  Surgeon: Daneil Dolin, MD;  Location: [facility];  Service: Endoscopy;;  . CHOLECYSTECTOMY    . COLONOSCOPY WITH PROPOFOL N/A 08/18/2019   Procedure: COLONOSCOPY WITH PROPOFOL;  Surgeon: Daneil Dolin, MD;  Location: [facility];  Service: Endoscopy;  Laterality: N/A;  2:45pm, ok per Threasa Beards  . IR IMAGING GUIDED PORT INSERTION  08/27/2019  . KNEE SURGERY    . LITHOTRIPSY         Family History  Problem Relation Age of Onset  . Emphysema Mother   . Stroke Father   . Cirrhosis Sister   . Heart disease Maternal Grandfather   . Diabetes Paternal Grandmother   . Colon cancer Paternal Grandfather   . Emphysema Sister     . Colon cancer Sister   . Emphysema Brother   . Thyroid disease Daughter   . Glaucoma Daughter   . Heart disease Son   . Gout Son   . Hypertension Son     Social History   Tobacco Use  . Smoking status: Former Smoker    Quit date: 1985    Years since quitting: 36.5  . Smokeless tobacco: Never Used  Vaping Use  . Vaping Use: Never used  Substance Use Topics  . Alcohol use: No  . Drug use: No    Home Medications Prior to Admission medications   Medication Sig Start Date End Date Taking? Authorizing Provider  acetaminophen (TYLENOL) 325 MG tablet Take 325 mg by mouth at bedtime.    Yes [provider]  ALPRAZolam (XANAX) 0.25 MG tablet TAKE 1 TABLET TWICE DAILY AS NEEDED FOR ANXIETY Patient taking differently: Take 0.25 mg by mouth 2 (two) times daily as needed for anxiety or sleep.  09/22/19  Yes Lockamy, Randi L, NP-C  atorvastatin (LIPITOR) 40 MG tablet Take 1 tablet (40 mg total) by mouth daily. 07/13/19 10/11/19 Yes Herminio Commons, MD  Cyanocobalamin (B-12 PO) Take 1 tablet by mouth daily.  Yes [provider]  escitalopram (LEXAPRO) 10 MG tablet Take 1 tablet (10 mg total) by mouth daily. 08/24/19  Yes Derek Jack, MD  gabapentin (NEURONTIN) 300 MG capsule Take 300 mg by mouth 3 (three) times daily. 04/27/19  Yes [provider]  Glucosamine HCl (GLUCOSAMINE PO) Take 1 tablet by mouth daily.   Yes [provider]  HYDROcodone-acetaminophen (NORCO) 10-325 MG tablet Take 1 tablet by mouth every 8 (eight) hours as needed. Patient taking differently: Take 1 tablet by mouth in the morning and at bedtime.  09/10/19  Yes Derek Jack, MD  Lactulose 20 GM/30ML SOLN Take 30 mLs (20 g total) by mouth at bedtime. 08/24/19  Yes Derek Jack, MD  LORazepam (ATIVAN) 0.5 MG tablet Take 0.5 mg by mouth at bedtime as needed for anxiety or sleep.    Yes [provider]  magic mouthwash w/lidocaine SOLN Take 5 mLs by mouth  4 (four) times daily as needed for mouth pain. Swish and spit. 09/14/19  Yes Derek Jack, MD  magnesium citrate SOLN Drink 1/2 bottle; repeat 2 hours later if no bowel movement Patient taking differently: Take 0.5-1 Bottles by mouth See admin instructions. Drink 1/2 bottle; repeat 2 hours later if no bowel movement 09/22/19  Yes Kefalas, Manon Hilding, PA-C  megestrol (MEGACE) 40 MG/ML suspension Take 400 mg by mouth daily.   Yes [provider]  meloxicam (MOBIC) 15 MG tablet Take 15 mg by mouth daily.    Yes [provider]  metoprolol tartrate (LOPRESSOR) 25 MG tablet Take 1 tablet (25 mg total) by mouth 2 (two) times daily. 07/13/19 10/11/19 Yes Herminio Commons, MD  Multiple Vitamin (MULTIVITAMIN WITH MINERALS) TABS tablet Take 1 tablet by mouth daily.   Yes [provider]  Omega-3 Fatty Acids (FISH OIL) 500 MG CAPS Take 1 capsule by mouth daily.    Yes [provider]  pantoprazole (PROTONIX) 40 MG tablet Take 40 mg by mouth daily. 06/08/19  Yes [provider]  prochlorperazine (COMPAZINE) 10 MG tablet Take 1 tablet (10 mg total) by mouth every 6 (six) hours as needed (Nausea or vomiting). 09/08/19  Yes Derek Jack, MD  promethazine (PHENERGAN) 25 MG tablet Take 25 mg by mouth as needed for nausea or vomiting.  07/09/19  Yes [provider]  senna-docusate (SENNA S) 8.6-50 MG tablet Take 1 tablet by mouth 2 (two) times daily. Patient taking differently: Take 1 tablet by mouth 2 (two) times daily as needed for mild constipation.  09/22/19  Yes Kefalas, Manon Hilding, PA-C  sucralfate (CARAFATE) 1 g tablet Take 1 g by mouth 4 (four) times daily -  with meals and at bedtime.  07/02/19  Yes [provider]  triamcinolone (KENALOG) 0.1 % paste Use as directed 1 application in the mouth or throat at bedtime. APPLIED TO TEETH NIGHTLY 09/16/19  Yes [provider]  benazepril (LOTENSIN) 10 MG tablet Take 10 mg by mouth  daily. Patient not taking: Reported on 09/30/2019    [provider]  BEVACIZUMAB IV Inject 5 mg/kg into the vein every 14 (fourteen) days. Patient not taking: Reported on 09/30/2019 09/09/19   [provider]  fluorouracil CALGB 32202 in sodium chloride 0.9 % 150 mL Inject 2,400 mg/m2 into the vein over 48 hr. Patient not taking: Reported on 09/30/2019 09/09/19   [provider]  FLUOROURACIL IV Inject 400 mg/m2 into the vein every 14 (fourteen) days. Patient not taking: Reported on 09/30/2019 09/09/19   [provider]  LEUCOVORIN CALCIUM IV Inject 400 mg/m2 into the vein every 14 (fourteen) days. Patient not taking: Reported on 09/30/2019 09/09/19   [provider]  lidocaine-prilocaine (EMLA) cream Apply a small amount to port a cath site and cover with plastic wrap 1 hour prior to chemotherapy appointments Patient not taking: Reported on 09/23/2019 09/08/19   Derek Jack, MD  lisinopril-hydrochlorothiazide (ZESTORETIC) 10-12.5 MG tablet Take 1 tablet by mouth daily. Patient not taking: Reported on 09/30/2019 05/04/19   [provider]  ondansetron (ZOFRAN) 8 MG tablet Take 8 mg by mouth as needed for nausea or vomiting.  Patient not taking: Reported on 09/23/2019 07/16/19   [provider]  oxaliplatin in dextrose 5 % 500 mL Inject 85 mg/m2 into the vein once. Patient not taking: Reported on 09/30/2019 09/09/19   [provider]    Allergies    Advil [ibuprofen], Nyquil multi-symptom [pseudoeph-doxylamine-dm-apap], and Sulfa antibiotics  Review of Systems   Review of Systems  Respiratory: Positive for shortness of breath.   Neurological: Positive for weakness.  All other systems reviewed and are negative.   Physical Exam Updated Vital Signs BP 121/81   Pulse 86   Temp 98 F (36.7 C)   Resp (!) 23   Ht 6\' 1"  (1.854 m)   Wt 88.2 kg   SpO2 99%   BMI 25.65 kg/m   Physical Exam Vitals and nursing note reviewed.   Constitutional:      Appearance: He is ill-appearing.  HENT:     Head: Normocephalic and atraumatic.     Mouth/Throat:     Mouth: Mucous membranes are moist.     Pharynx: Oropharynx is clear.  Eyes:     Extraocular Movements: Extraocular movements intact.     Pupils: Pupils are equal, round, and reactive to light.  Cardiovascular:     Rate and Rhythm: Normal rate and regular rhythm.  Pulmonary:     Effort: Tachypnea present.  Abdominal:     General: Bowel sounds are normal.     Palpations: Abdomen is soft.  Musculoskeletal:        General: Normal range of motion.     Cervical back: Normal range of motion and neck supple.  Skin:    General: Skin is warm.     Capillary Refill: Capillary refill takes less than 2 seconds.  Neurological:     General: No focal deficit present.     Mental Status: He is alert and oriented to person, place, and time.  Psychiatric:        Mood and Affect: Mood normal.        Behavior: Behavior normal.     ED Results / Procedures / Treatments   Labs (all labs ordered are listed, but only abnormal results are displayed) Labs Reviewed  BASIC METABOLIC PANEL - Abnormal; Notable for the following components:      Result Value   Sodium 133 (*)    Potassium 3.3 (*)    Chloride 97 (*)    Glucose, Bld 106 (*)    Calcium 8.5 (*)    All other components within normal limits  BRAIN NATRIURETIC PEPTIDE - Abnormal; Notable for the following components:   B Natriuretic Peptide 142.0 (*)    All other components within normal limits  CBC WITH DIFFERENTIAL/PLATELET - Abnormal; Notable for the following components:   RBC 3.61 (*)    Hemoglobin 10.2 (*)    HCT 33.1 (*)    RDW 20.5 (*)  Platelets 621 (*)    Abs Immature Granulocytes 0.08 (*)    All other components within normal limits  SARS CORONAVIRUS 2 BY RT PCR (HOSPITAL ORDER, [facility])  TYPE AND SCREEN  TROPONIN I (HIGH SENSITIVITY)  TROPONIN I (HIGH SENSITIVITY)     EKG EKG Interpretation  Date/Time:  Wednesday September 30 2019 19:34:17 EDT Ventricular Rate:  81 PR Interval:  148 QRS Duration: 74 QT Interval:  412 QTC Calculation: 478 R Axis:   23 Text Interpretation: Normal sinus rhythm Possible Inferior infarct , age undetermined Abnormal ECG No significant change since last tracing Confirmed by Isla Pence [phone]) on 09/30/2019 7:42:45 PM   Radiology DG Chest 2 View  Result Date: 09/30/2019 CLINICAL DATA:  Shortness of breath. EXAM: CHEST - 2 VIEW COMPARISON:  September 13, 2019. FINDINGS: Stable cardiomediastinal silhouette. No pneumothorax is noted. Right internal jugular Port-A-Cath is unchanged in position moderate left pleural effusion is noted with associated left basilar atelectasis or infiltrate. Minimal right basilar atelectasis is noted. Small right pleural effusion is noted. Bony thorax is unremarkable. IMPRESSION: Moderate left pleural effusion with associated left basilar atelectasis or infiltrate. Small right pleural effusion. Electronically Signed   By: Marijo Conception M.D.   On: 09/30/2019 20:00    Procedures Procedures (including critical care time)  Medications Ordered in ED Medications  furosemide (LASIX) injection 40 mg (40 mg Intravenous Given 09/30/19 2244)  HYDROcodone-acetaminophen (NORCO/VICODIN) 5-325 MG per tablet 1 tablet (1 tablet Oral Given 09/30/19 2245)    ED Course  I have reviewed the triage vital signs and the nursing notes.  Pertinent labs & imaging results that were available during my care of the patient were reviewed by me and considered in my medical decision making (see chart for details).    MDM Rules/Calculators/A&P                          With ambulation, pt's O2 sat dropped to 86% and his RR went up.  Pt has a moderate left pleural effusion.  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  Pt placed on 2L oxygen.  Pt d/w Dr. Humphrey Rolls (triad) for admission.  CRITICAL  CARE Performed by: Isla Pence   Total critical care time: 30  minutes  Critical care time was exclusive of separately billable procedures and treating other patients.  Critical care was necessary to treat or prevent imminent or life-threatening deterioration.  Critical care was time spent personally by me on the following activities: development of treatment plan with patient and/or surrogate as well as nursing, discussions with consultants, evaluation of patient's response to treatment, examination of patient, obtaining history from patient or surrogate, ordering and performing treatments and interventions, ordering and review of laboratory studies, ordering and review of radiographic studies, pulse oximetry and re-evaluation of patient's condition.  Final Clinical Impression(s) / ED Diagnoses Final diagnoses:  SOB (shortness of breath)  Pleural effusion on left  Colon cancer metastasized to multiple sites [facility])    Rx / [facility] Orders ED Discharge Orders    None       Isla Pence, MD 09/30/19 2314

## 2019-09-30 NOTE — ED Triage Notes (Signed)
Pt complains of shortness of breath for 2 months but worse today. Also states increase in weakness. 98% on room air

## 2019-09-30 NOTE — Telephone Encounter (Signed)
Reports worsening SOB over the past 1-2 weeks that occurs with activity and rest. Reports swelling in feet. Weight last week office visit/Cancer Center was 191 lbs. Does not have a scale at home to weigh. Reports having a little cough and congestion. Denies fever, n/v or chest pain. Denies confusion, blurred vision. Reports having dizziness all the time but says its worse today. Unable to check vital signs at home. Reports seeing PCP yesterday for SOB and was advised to contact our office. Says he had an appointment with is oncologist tomorrow but he canceled it because he felt so bad. Advised patient that he should have kept that appointment and that he should contact their office with his symptoms. Advised patient that he also should go to the ED for an evaluation for worsening SOB and dizziness since his last hemoglobin was 8.7. Verbalized understanding of plan.

## 2019-09-30 NOTE — Telephone Encounter (Signed)
Pt called stating he's having shortness of breath, he's had it all day long and it's not getting any better.  Please call [phone]

## 2019-10-01 ENCOUNTER — Telehealth: Payer: Medicare Other | Admitting: Cardiovascular Disease

## 2019-10-01 ENCOUNTER — Observation Stay (HOSPITAL_COMMUNITY): Payer: Medicare Other

## 2019-10-01 ENCOUNTER — Encounter (HOSPITAL_COMMUNITY): Payer: Self-pay | Admitting: Internal Medicine

## 2019-10-01 ENCOUNTER — Encounter (HOSPITAL_COMMUNITY): Payer: Medicare Other

## 2019-10-01 DIAGNOSIS — J9601 Acute respiratory failure with hypoxia: Secondary | ICD-10-CM | POA: Diagnosis not present

## 2019-10-01 DIAGNOSIS — J948 Other specified pleural conditions: Secondary | ICD-10-CM | POA: Diagnosis not present

## 2019-10-01 DIAGNOSIS — J90 Pleural effusion, not elsewhere classified: Secondary | ICD-10-CM | POA: Diagnosis not present

## 2019-10-01 DIAGNOSIS — J9811 Atelectasis: Secondary | ICD-10-CM | POA: Diagnosis not present

## 2019-10-01 LAB — BODY FLUID CELL COUNT WITH DIFFERENTIAL
Eos, Fluid: 0 %
Lymphs, Fluid: 89 %
Monocyte-Macrophage-Serous Fluid: 2 % — ABNORMAL LOW (ref 50–90)
Neutrophil Count, Fluid: 9 % (ref 0–25)
Other Cells, Fluid: REACTIVE %
Total Nucleated Cell Count, Fluid: 362 cu mm (ref 0–1000)

## 2019-10-01 LAB — CBC
HCT: 30 % — ABNORMAL LOW (ref 39.0–52.0)
Hemoglobin: 9.1 g/dL — ABNORMAL LOW (ref 13.0–17.0)
MCH: 27.8 pg (ref 26.0–34.0)
MCHC: 30.3 g/dL (ref 30.0–36.0)
MCV: 91.7 fL (ref 80.0–100.0)
Platelets: 538 10*3/uL — ABNORMAL HIGH (ref 150–400)
RBC: 3.27 MIL/uL — ABNORMAL LOW (ref 4.22–5.81)
RDW: 20.4 % — ABNORMAL HIGH (ref 11.5–15.5)
WBC: 8.5 10*3/uL (ref 4.0–10.5)
nRBC: 0 % (ref 0.0–0.2)

## 2019-10-01 LAB — BASIC METABOLIC PANEL
Anion gap: 11 (ref 5–15)
BUN: 21 mg/dL (ref 8–23)
CO2: 27 mmol/L (ref 22–32)
Calcium: 8.2 mg/dL — ABNORMAL LOW (ref 8.9–10.3)
Chloride: 98 mmol/L (ref 98–111)
Creatinine, Ser: 0.67 mg/dL (ref 0.61–1.24)
GFR calc Af Amer: >60 mL/min (ref >60)
GFR calc non Af Amer: >60 mL/min (ref >60)
Glucose, Bld: 90 mg/dL (ref 70–99)
Potassium: 3.8 mmol/L (ref 3.5–5.1)
Sodium: 136 mmol/L (ref 135–145)

## 2019-10-01 LAB — GRAM STAIN

## 2019-10-01 MED ORDER — SUCRALFATE 1 G PO TABS
1.0000 g | ORAL_TABLET | Freq: Three times a day (TID) | ORAL | Status: DC
  Administered 2019-10-01: 1 g via ORAL
  Filled 2019-10-01: qty 1

## 2019-10-01 MED ORDER — PANTOPRAZOLE SODIUM 40 MG PO TBEC
40.0000 mg | DELAYED_RELEASE_TABLET | Freq: Every day | ORAL | Status: DC
  Administered 2019-10-01: 40 mg via ORAL
  Filled 2019-10-01: qty 1

## 2019-10-01 MED ORDER — ESCITALOPRAM OXALATE 10 MG PO TABS
10.0000 mg | ORAL_TABLET | Freq: Every day | ORAL | Status: DC
  Administered 2019-10-01: 10 mg via ORAL
  Filled 2019-10-01: qty 1

## 2019-10-01 MED ORDER — ATORVASTATIN CALCIUM 40 MG PO TABS
40.0000 mg | ORAL_TABLET | Freq: Every day | ORAL | Status: DC
  Administered 2019-10-01: 40 mg via ORAL
  Filled 2019-10-01: qty 1

## 2019-10-01 MED ORDER — GABAPENTIN 300 MG PO CAPS
300.0000 mg | ORAL_CAPSULE | Freq: Three times a day (TID) | ORAL | Status: DC
  Administered 2019-10-01: 300 mg via ORAL
  Filled 2019-10-01: qty 1

## 2019-10-01 MED ORDER — MEGESTROL ACETATE 40 MG/ML PO SUSP
400.0000 mg | Freq: Every day | ORAL | Status: DC
  Administered 2019-10-01: 400 mg via ORAL
  Filled 2019-10-01 (×2): qty 10

## 2019-10-01 MED ORDER — MAGIC MOUTHWASH W/LIDOCAINE: 5.0000 mL | Freq: Four times a day (QID) | ORAL | Status: DC | PRN

## 2019-10-01 MED ORDER — PROCHLORPERAZINE MALEATE 5 MG PO TABS: 10.0000 mg | ORAL_TABLET | Freq: Four times a day (QID) | ORAL | Status: DC | PRN

## 2019-10-01 MED ORDER — PROMETHAZINE HCL 12.5 MG PO TABS: 25.0000 mg | ORAL_TABLET | ORAL | Status: DC | PRN

## 2019-10-01 MED ORDER — ENOXAPARIN SODIUM 40 MG/0.4ML ~~LOC~~ SOLN
40.0000 mg | SUBCUTANEOUS | Status: DC
  Administered 2019-10-01: 40 mg via SUBCUTANEOUS
  Filled 2019-10-01: qty 0.4

## 2019-10-01 MED ORDER — METOPROLOL TARTRATE 25 MG PO TABS
25.0000 mg | ORAL_TABLET | Freq: Two times a day (BID) | ORAL | Status: DC
  Administered 2019-10-01: 25 mg via ORAL
  Filled 2019-10-01: qty 1

## 2019-10-01 MED ORDER — LORAZEPAM 0.5 MG PO TABS: 0.5000 mg | ORAL_TABLET | Freq: Every evening | ORAL | Status: DC | PRN

## 2019-10-01 MED ORDER — MORPHINE SULFATE (PF) 2 MG/ML IV SOLN: 2.0000 mg | INTRAVENOUS | Status: DC | PRN

## 2019-10-01 MED ORDER — HYDROCODONE-ACETAMINOPHEN 10-325 MG PO TABS: 1.0000 | ORAL_TABLET | Freq: Four times a day (QID) | ORAL | Status: DC | PRN

## 2019-10-01 MED ORDER — ACETAMINOPHEN 325 MG PO TABS: 325.0000 mg | ORAL_TABLET | Freq: Every day | ORAL | Status: DC

## 2019-10-01 MED ORDER — LACTULOSE 10 GM/15ML PO SOLN: 20.0000 g | Freq: Every day | ORAL | Status: DC

## 2019-10-01 MED ORDER — ALPRAZOLAM 0.25 MG PO TABS: 0.2500 mg | ORAL_TABLET | Freq: Two times a day (BID) | ORAL | Status: DC | PRN

## 2019-10-01 MED ORDER — ONDANSETRON HCL 4 MG/2ML IJ SOLN: 4.0000 mg | Freq: Four times a day (QID) | INTRAMUSCULAR | Status: DC | PRN

## 2019-10-01 MED ORDER — OXYCODONE HCL 5 MG PO TABS: 5.0000 mg | ORAL_TABLET | ORAL | Status: DC | PRN

## 2019-10-01 MED ORDER — ONDANSETRON HCL 4 MG PO TABS: 4.0000 mg | ORAL_TABLET | Freq: Four times a day (QID) | ORAL | Status: DC | PRN

## 2019-10-01 MED ORDER — SENNOSIDES-DOCUSATE SODIUM 8.6-50 MG PO TABS
1.0000 | ORAL_TABLET | Freq: Two times a day (BID) | ORAL | Status: DC
  Administered 2019-10-01: 1 via ORAL
  Filled 2019-10-01: qty 1

## 2019-10-01 NOTE — Telephone Encounter (Signed)
Agree with plan regarding ER evaluation.   Zandra Abts MD

## 2019-10-01 NOTE — Progress Notes (Signed)
   10/01/19 0205  Vitals  Temp 98.2 F (36.8 C)  Temp Source Oral  BP 122/86  MAP (mmHg) 98  BP Location Right Arm  BP Method Automatic  Patient Position (if appropriate) Lying  Pulse Rate 87  Pulse Rate Source Monitor  Resp (!) 24  Oxygen Therapy  SpO2 97 %  O2 Device Nasal Cannula  O2 Flow Rate (L/min) 2 L/min  MEWS Score  MEWS Temp 0  MEWS Systolic 0  MEWS Pulse 0  MEWS RR 1  MEWS LOC 0  MEWS Score 1  MEWS Score Color Green   The patient is admitted from ER to room 310. A & O x 4. Denied any acute pain. Noted  extreme HOH. Full assessment to epic completed. Patient is oriented to staff, call bell and his room. Voiced no concern. Will continue to monitor.

## 2019-10-01 NOTE — Discharge Summary (Signed)
Physician Discharge Summary  [name] NID:[id number] DOB: 06-May-1945 DOA: 09/30/2019  PCP: Richard Squibb, MD  Admit date: 09/30/2019  Discharge date: 10/01/2019  Admitted From:Home  Disposition:  Home  Recommendations for Outpatient Follow-up:  1. Follow up with PCP in 1-2 weeks 2. Continue home medications as prior  Home Health: None  Equipment/Devices: None  Discharge Condition: Stable  CODE STATUS: Full  Diet recommendation: Heart Healthy  Brief/Interim Summary: Per HPI: Richard Conway is a 74 y.o. male with medical history significant of hypertension, hyperlipidemia, metastatic colon cancer on chemotherapy, coronary artery disease and kidney stones presented to ED for evaluation of worsening shortness of breath.  Patient states that the problem started as exertional dyspnea about 2 months ago and it gradually continues to worsen and since last week he is having continuous shortness of breath even at rest.  Patient is also complaining of severe generalized weakness and some chest tightness that is resolved after Norco in the ED.  Patient admits generalized muscular pain but denies fever, chills, sore throat, cough, nausea, vomiting, abdominal pain, diarrhea, dysuria and peripheral edema.    ED Course: On arrival to the ED had temperature of 98, blood pressure 100/73, heart rate 82, respiratory rate 17 and oxygen saturation was 96% on room air.  Oxygen saturation dropped to 86% on room air when patient took a few steps in the ED and patient also became tachypneic therefore started on 2 L of oxygen with nasal cannula.  Blood work showed WBC 9.1, hemoglobin 10.2, sodium 133, potassium 3.3, BUN 21, creatinine 0.65, blood glucose 106 and BNP 142.  Troponin negative.  Covid test negative.  X-ray chest showed moderate left-sided pleural effusion.  Patient was given a dose of IV Lasix and a dose of Norco in the ED.  Patient was admitted with acute hypoxemic respiratory failure and  dyspnea secondary to moderate left-sided pleural effusion in the setting of metastatic colon cancer.  He has undergone thoracentesis with radiology this morning with 1.9 L of fluid removed.  Various labs have been ordered and are pending further evaluation.  He does not appear to have any infectious cause and much of this is likely related to his colon cancer.  I have recommended close follow-up with his PCP in the next 1 week.  He has no further hypoxemia or dyspnea noted and is eager for discharge.  No further acute events noted throughout the course of this hospitalization.  He is in stable condition for discharge.  Discharge Diagnoses:  Principal Problem:   Acute respiratory failure with hypoxia ([place]) Active Problems:   Metastatic cancer ([place])  Principal discharge diagnosis: Acute hypoxemic respiratory failure secondary to moderate left-sided pleural effusion.  Discharge Instructions  Discharge Instructions    Diet - low sodium heart healthy   Complete by: As directed    Increase activity slowly   Complete by: As directed      Allergies as of 10/01/2019      Reactions   Advil [ibuprofen] Other (See Comments)   Face and body numbness   Nyquil Multi-symptom [pseudoeph-doxylamine-dm-apap] Other (See Comments)   Increase in bp   Sulfa Antibiotics Nausea And Vomiting      Medication List    TAKE these medications   acetaminophen 325 MG tablet Commonly known as: TYLENOL Take 325 mg by mouth at bedtime.   ALPRAZolam 0.25 MG tablet Commonly known as: XANAX TAKE 1 TABLET TWICE DAILY AS NEEDED FOR ANXIETY What changed: See the new instructions.  atorvastatin 40 MG tablet Commonly known as: LIPITOR Take 1 tablet (40 mg total) by mouth daily.   B-12 PO Take 1 tablet by mouth daily.   benazepril 10 MG tablet Commonly known as: LOTENSIN Take 10 mg by mouth daily.   BEVACIZUMAB IV Inject 5 mg/kg into the vein every 14 (fourteen) days.   escitalopram 10 MG tablet Commonly  known as: LEXAPRO Take 1 tablet (10 mg total) by mouth daily.   Fish Oil 500 MG Caps Take 1 capsule by mouth daily.   fluorouracil CALGB 11941 in sodium chloride 0.9 % 150 mL Inject 2,400 mg/m2 into the vein over 48 hr.   FLUOROURACIL IV Inject 400 mg/m2 into the vein every 14 (fourteen) days.   gabapentin 300 MG capsule Commonly known as: NEURONTIN Take 300 mg by mouth 3 (three) times daily.   GLUCOSAMINE PO Take 1 tablet by mouth daily.   HYDROcodone-acetaminophen 10-325 MG tablet Commonly known as: NORCO Take 1 tablet by mouth every 8 (eight) hours as needed. What changed: when to take this   Lactulose 20 GM/30ML Soln Take 30 mLs (20 g total) by mouth at bedtime.   LEUCOVORIN CALCIUM IV Inject 400 mg/m2 into the vein every 14 (fourteen) days.   lidocaine-prilocaine cream Commonly known as: EMLA Apply a small amount to port a cath site and cover with plastic wrap 1 hour prior to chemotherapy appointments   lisinopril-hydrochlorothiazide 10-12.5 MG tablet Commonly known as: ZESTORETIC Take 1 tablet by mouth daily.   LORazepam 0.5 MG tablet Commonly known as: ATIVAN Take 0.5 mg by mouth at bedtime as needed for anxiety or sleep.   magic mouthwash w/lidocaine Soln Take 5 mLs by mouth 4 (four) times daily as needed for mouth pain. Swish and spit.   magnesium citrate Soln Drink 1/2 bottle; repeat 2 hours later if no bowel movement What changed:   how much to take  how to take this  when to take this   megestrol 40 MG/ML suspension Commonly known as: MEGACE Take 400 mg by mouth daily.   meloxicam 15 MG tablet Commonly known as: MOBIC Take 15 mg by mouth daily.   metoprolol tartrate 25 MG tablet Commonly known as: LOPRESSOR Take 1 tablet (25 mg total) by mouth 2 (two) times daily.   multivitamin with minerals Tabs tablet Take 1 tablet by mouth daily.   ondansetron 8 MG tablet Commonly known as: ZOFRAN Take 8 mg by mouth as needed for nausea or  vomiting.   oxaliplatin in dextrose 5 % 500 mL Inject 85 mg/m2 into the vein once.   pantoprazole 40 MG tablet Commonly known as: PROTONIX Take 40 mg by mouth daily.   prochlorperazine 10 MG tablet Commonly known as: COMPAZINE Take 1 tablet (10 mg total) by mouth every 6 (six) hours as needed (Nausea or vomiting).   promethazine 25 MG tablet Commonly known as: PHENERGAN Take 25 mg by mouth as needed for nausea or vomiting.   senna-docusate 8.6-50 MG tablet Commonly known as: Senna S Take 1 tablet by mouth 2 (two) times daily. What changed:   when to take this  reasons to take this   sucralfate 1 g tablet Commonly known as: CARAFATE Take 1 g by mouth 4 (four) times daily -  with meals and at bedtime.   triamcinolone 0.1 % paste Commonly known as: KENALOG Use as directed 1 application in the mouth or throat at bedtime. APPLIED TO TEETH NIGHTLY       Follow-up Information  Richard Squibb, MD Follow up in 1 week(s).   Specialty: Internal Medicine Contact information: [place] [address] [phone]              Allergies  Allergen Reactions  . Advil [Ibuprofen] Other (See Comments)    Face and body numbness  . Nyquil Multi-Symptom [Pseudoeph-Doxylamine-Dm-Apap] Other (See Comments)    Increase in bp   . Sulfa Antibiotics Nausea And Vomiting    Consultations:  None   Procedures/Studies: DG Chest 1 View  Result Date: 10/01/2019 CLINICAL DATA:  LEFT pleural effusion post thoracentesis EXAM: CHEST  1 VIEW COMPARISON:  09/30/2019 FINDINGS: RIGHT jugular Port-A-Cath unchanged. Significantly decreased LEFT pleural effusion post thoracentesis. Bibasilar atelectasis. No pneumothorax. Osseous structures unremarkable. IMPRESSION: No pneumothorax following LEFT thoracentesis. Electronically Signed   By: Lavonia Dana M.D.   On: 10/01/2019 11:12   DG Chest 2 View  Result Date: 09/30/2019 CLINICAL DATA:  Shortness of breath. EXAM: CHEST - 2  VIEW COMPARISON:  September 13, 2019. FINDINGS: Stable cardiomediastinal silhouette. No pneumothorax is noted. Right internal jugular Port-A-Cath is unchanged in position moderate left pleural effusion is noted with associated left basilar atelectasis or infiltrate. Minimal right basilar atelectasis is noted. Small right pleural effusion is noted. Bony thorax is unremarkable. IMPRESSION: Moderate left pleural effusion with associated left basilar atelectasis or infiltrate. Small right pleural effusion. Electronically Signed   By: Marijo Conception M.D.   On: 09/30/2019 20:00   DG Chest Port 1 View  Result Date: 09/13/2019 CLINICAL DATA:  Lip swelling and leg swelling since yesterday, history of MI EXAM: PORTABLE CHEST 1 VIEW COMPARISON:  June 15, 2019 and study of 08/27/2019 FINDINGS: RIGHT IJ Port-A-Cath terminating at the distal aspect of the superior vena cava. Opacification in the LEFT hemithorax with blunting of costodiaphragmatic sulcus and complete obscuration of LEFT hemidiaphragm. Heart size is likely stable but partially obscured as well. Increased interstitial markings in the RIGHT chest. Nodular densities projecting over the RIGHT peripheral chest, of uncertain significance not accounted for on prior CT or PET imaging definitively but with pleural nodularity on the prior PET that would have to be more conspicuous and or enlarged to be visible on the current x-ray. Visualized skeletal structures on limited assessment without acute finding. IMPRESSION: 1. LEFT pleural effusion and basilar airspace disease along with generalized increased interstitial markings. Findings may be related to heart failure. Airspace disease at the LEFT lung base remains nonspecific. 2. Increasing nodularity along the peripheral RIGHT chest may represent worsening pleural disease which is an alternative explanation for pleural effusion as well. Electronically Signed   By: Zetta Bills M.D.   On: 09/13/2019 16:16   US  THORACENTESIS ASP PLEURAL SPACE W/IMG GUIDE  Result Date: 10/01/2019 INDICATION: LEFT pleural effusion, history of metastatic colon cancer, coronary artery disease, hypertension EXAM: ULTRASOUND GUIDED DIAGNOSTIC AND THERAPEUTIC LEFT THORACENTESIS MEDICATIONS: None. COMPLICATIONS: None immediate. PROCEDURE: An ultrasound guided thoracentesis was thoroughly discussed with the patient and questions answered. The benefits, risks, alternatives and complications were also discussed. The patient understands and wishes to proceed with the procedure. Written consent was obtained. Ultrasound was performed to localize and mark an adequate pocket of fluid in the LEFT chest. The area was then prepped and draped in the normal sterile fashion. 1% Lidocaine was used for local anesthesia. Under ultrasound guidance a 8 French thoracentesis catheter was introduced. Thoracentesis was performed. The catheter was removed and a dressing applied. FINDINGS: A total of approximately 1.9  L of reddish LEFT pleural fluid was removed. Samples were sent to the laboratory as requested by the clinical team. Patient reported symptomatic improvement following the procedure. IMPRESSION: Successful ultrasound guided LEFT thoracentesis yielding 1.9 L of pleural fluid. Electronically Signed   By: Lavonia Dana M.D.   On: 10/01/2019 11:11     Discharge Exam: Vitals:   10/01/19 1026 10/01/19 1050  BP: (!) 150/92 (!) 141/92  Pulse: 90 94  Resp: 20 20  Temp:    SpO2: 98% 99%   Vitals:   10/01/19 0540 10/01/19 1000 10/01/19 1026 10/01/19 1050  BP: (!) 143/91 (!) 146/96 (!) 150/92 (!) 141/92  Pulse: 88 92 90 94  Resp: 20 20 20 20   Temp: 97.9 F (36.6 C) (!) 97.5 F (36.4 C)    TempSrc: Oral Oral    SpO2: 98% 98% 98% 99%  Weight:      Height:        General: Pt is alert, awake, not in acute distress Cardiovascular: RRR, S1/S2 +, no rubs, no gallops Respiratory: CTA bilaterally, no wheezing, no rhonchi Abdominal: Soft, NT, ND, bowel  sounds + Extremities: no edema, no cyanosis    The results of significant diagnostics from this hospitalization (including imaging, microbiology, ancillary and laboratory) are listed below for reference.     Microbiology: Recent Results (from the past 240 hour(s))  SARS Coronavirus 2 by RT PCR (hospital order, performed in [facility] hospital lab) Nasopharyngeal Nasopharyngeal Swab     Status: None   Collection Time: 09/30/19  8:26 PM   Specimen: Nasopharyngeal Swab  Result Value Ref Range Status   SARS Coronavirus 2 NEGATIVE NEGATIVE Final    Comment: (NOTE) SARS-CoV-2 target nucleic acids are NOT DETECTED.  The SARS-CoV-2 RNA is generally detectable in upper and lower respiratory specimens during the acute phase of infection. The lowest concentration of SARS-CoV-2 viral copies this assay can detect is 250 copies / mL. A negative result does not preclude SARS-CoV-2 infection and should not be used as the sole basis for treatment or other patient management decisions.  A negative result may occur with improper specimen collection / handling, submission of specimen other than nasopharyngeal swab, presence of viral mutation(s) within the areas targeted by this assay, and inadequate number of viral copies (<250 copies / mL). A negative result must be combined with clinical observations, patient history, and epidemiological information.  Fact Sheet for Patients:   StrictlyIdeas.no  Fact Sheet for Healthcare Providers: BankingDealers.co.za  This test is not yet approved or  cleared by the [place] FDA and has been authorized for detection and/or diagnosis of SARS-CoV-2 by FDA under an Emergency Use Authorization (EUA).  This EUA will remain in effect (meaning this test can be used) for the duration of the COVID-19 declaration under Section 564(b)(1) of the Act, 21 U.S.C. section 360bbb-3(b)(1), unless the authorization is  terminated or revoked sooner.  Performed at [facility], [address], [place], [place] [address]      Labs: BNP (last 3 results) Recent Labs    09/30/19 2018  BNP 585.2*   Basic Metabolic Panel: Recent Labs  Lab 09/30/19 2018 10/01/19 0654  NA 133* 136  K 3.3* 3.8  CL 97* 98  CO2 24 27  GLUCOSE 106* 90  BUN 21 21  CREATININE 0.65 0.67  CALCIUM 8.5* 8.2*   Liver Function Tests: No results for input(s): AST, ALT, ALKPHOS, BILITOT, PROT, ALBUMIN in the last 168 hours. No results for input(s): LIPASE, AMYLASE  in the last 168 hours. No results for input(s): AMMONIA in the last 168 hours. CBC: Recent Labs  Lab 09/30/19 2018 10/01/19 0654  WBC 9.1 8.5  NEUTROABS 6.0  --   HGB 10.2* 9.1*  HCT 33.1* 30.0*  MCV 91.7 91.7  PLT 621* 538*   Cardiac Enzymes: No results for input(s): CKTOTAL, CKMB, CKMBINDEX, TROPONINI in the last 168 hours. BNP: Invalid input(s): POCBNP CBG: No results for input(s): GLUCAP in the last 168 hours. D-Dimer No results for input(s): DDIMER in the last 72 hours. Hgb A1c No results for input(s): HGBA1C in the last 72 hours. Lipid Profile No results for input(s): CHOL, HDL, LDLCALC, TRIG, CHOLHDL, LDLDIRECT in the last 72 hours. Thyroid function studies No results for input(s): TSH, T4TOTAL, T3FREE, THYROIDAB in the last 72 hours.  Invalid input(s): FREET3 Anemia work up No results for input(s): VITAMINB12, FOLATE, FERRITIN, TIBC, IRON, RETICCTPCT in the last 72 hours. Urinalysis    Component Value Date/Time   COLORURINE YELLOW 09/13/2019 1541   APPEARANCEUR CLEAR 09/13/2019 1541   LABSPEC 1.031 (H) 09/13/2019 1541   PHURINE 7.0 09/13/2019 1541   GLUCOSEU NEGATIVE 09/13/2019 1541   HGBUR NEGATIVE 09/13/2019 1541   BILIRUBINUR NEGATIVE 09/13/2019 1541   KETONESUR NEGATIVE 09/13/2019 1541   PROTEINUR 30 (A) 09/13/2019 1541   NITRITE NEGATIVE 09/13/2019 1541   LEUKOCYTESUR NEGATIVE 09/13/2019 1541   Sepsis Labs Invalid  input(s): PROCALCITONIN,  WBC,  LACTICIDVEN Microbiology Recent Results (from the past 240 hour(s))  SARS Coronavirus 2 by RT PCR (hospital order, performed in [place] hospital lab) Nasopharyngeal Nasopharyngeal Swab     Status: None   Collection Time: 09/30/19  8:26 PM   Specimen: Nasopharyngeal Swab  Result Value Ref Range Status   SARS Coronavirus 2 NEGATIVE NEGATIVE Final    Comment: (NOTE) SARS-CoV-2 target nucleic acids are NOT DETECTED.  The SARS-CoV-2 RNA is generally detectable in upper and lower respiratory specimens during the acute phase of infection. The lowest concentration of SARS-CoV-2 viral copies this assay can detect is 250 copies / mL. A negative result does not preclude SARS-CoV-2 infection and should not be used as the sole basis for treatment or other patient management decisions.  A negative result may occur with improper specimen collection / handling, submission of specimen other than nasopharyngeal swab, presence of viral mutation(s) within the areas targeted by this assay, and inadequate number of viral copies (<250 copies / mL). A negative result must be combined with clinical observations, patient history, and epidemiological information.  Fact Sheet for Patients:   StrictlyIdeas.no  Fact Sheet for Healthcare Providers: BankingDealers.co.za  This test is not yet approved or  cleared by the [place] FDA and has been authorized for detection and/or diagnosis of SARS-CoV-2 by FDA under an Emergency Use Authorization (EUA).  This EUA will remain in effect (meaning this test can be used) for the duration of the COVID-19 declaration under Section 564(b)(1) of the Act, 21 U.S.C. section 360bbb-3(b)(1), unless the authorization is terminated or revoked sooner.  Performed at [facility], [address], [place], [place] [address]      Time coordinating discharge: Over 35  minutes  SIGNED:   Rodena Goldmann, DO [facility] 10/01/2019, 12:28 PM  If 7PM-7AM, please contact night-coverage [email or web address]

## 2019-10-01 NOTE — Care Management Obs Status (Signed)
[place] NOTIFICATION   Patient Details  Name: Richard Conway MRN: [id number] Date of Birth: Apr 24, 1945   Medicare Observation Status Notification Given:  Yes    Boneta Lucks, RN 10/01/2019, 9:18 AM

## 2019-10-01 NOTE — Procedures (Signed)
PreOperative Dx: LEFT pleural effusion Postoperative Dx: LEFT pleural effusion Procedure:   US guided LEFT thoracentesis Radiologist:  Thornton Papas Anesthesia:  10 ml of 1% lidocaine Specimen:  1.9 L of reddish colored fluid EBL:   < 1 ml Complications: None

## 2019-10-01 NOTE — Progress Notes (Signed)
Thoracentesis complete no signs of distress.  

## 2019-10-01 NOTE — ED Notes (Signed)
Patient given oral care at this time. Swabbed mouth with oral mouth gel. Patient states that he is not having any pain at this time.  States that his legs feel numb due to swelling. Patient resting at this time.

## 2019-10-01 NOTE — Care Management Important Message (Signed)
Important Message  Patient Details  Name: Richard Conway MRN: [id number] Date of Birth: [birth year]   Medicare Important Message Given:  Yes     Boneta Lucks, RN 10/01/2019, 3:06 PM

## 2019-10-01 NOTE — H&P (Signed)
History and Physical    Richard Conway:[id number] DOB: 10-07-1945 DOA: 09/30/2019  PCP: Celene Squibb, MD (Confirm with patient/family/NH records and if not entered, this has to be entered at [facility] point of entry) Patient coming from: Home  I have personally briefly reviewed patient's old medical records in [place]  Chief Complaint: Shortness of breath  HPI: Richard Conway is a 74 y.o. male with medical history significant of hypertension, hyperlipidemia, metastatic colon cancer on chemotherapy, coronary artery disease and kidney stones presented to ED for evaluation of worsening shortness of breath.  Patient states that the problem started as exertional dyspnea about 2 months ago and it gradually continues to worsen and since last week he is having continuous shortness of breath even at rest.  Patient is also complaining of severe generalized weakness and some chest tightness that is resolved after Norco in the ED.  Patient admits generalized muscular pain but denies fever, chills, sore throat, cough, nausea, vomiting, abdominal pain, diarrhea, dysuria and peripheral edema.  (For level 3, the HPI must include 4+ descriptors: Location, Quality, Severity, Duration, Timing, Context, modifying factors, associated signs/symptoms and/or status of 3+ chronic problems.)  (Please avoid self-populating past medical history here) (The initial 2-3 lines should be focused and good to copy and paste in the HPI section of the daily progress note).  ED Course: On arrival to the ED had temperature of 98, blood pressure 100/73, heart rate 82, respiratory rate 17 and oxygen saturation was 96% on room air.  Oxygen saturation dropped to 86% on room air when patient took a few steps in the ED and patient also became tachypneic therefore started on 2 L of oxygen with nasal cannula.  Blood work showed WBC 9.1, hemoglobin 10.2, sodium 133, potassium 3.3, BUN 21, creatinine 0.65, blood glucose 106 and BNP 142.   Troponin negative.  Covid test negative.  X-ray chest showed moderate left-sided pleural effusion.  Patient was given a dose of IV Lasix and a dose of Norco in the ED.  Review of Systems: As per HPI otherwise 10 point review of systems negative.  Unacceptable ROS statements: "10 systems reviewed," "Extensive" (without elaboration).  Acceptable ROS statements: "All others negative," "All others reviewed and are negative," and "All others unremarkable," with at [place] documented Can't double dip - if using for HPI can't use for ROS  Past Medical History:  Diagnosis Date  . Arthritis   . CAD (coronary artery disease)   . Cancer ([place])   . Hypertension   . Port-A-Cath in place 09/08/2019  . Renal disorder    kidney stones    Past Surgical History:  Procedure Laterality Date  . BIOPSY  08/18/2019   Procedure: BIOPSY;  Surgeon: Daneil Dolin, MD;  Location: [facility];  Service: Endoscopy;;  . CHOLECYSTECTOMY    . COLONOSCOPY WITH PROPOFOL N/A 08/18/2019   Procedure: COLONOSCOPY WITH PROPOFOL;  Surgeon: Daneil Dolin, MD;  Location: [facility];  Service: Endoscopy;  Laterality: N/A;  2:45pm, ok per Threasa Beards  . IR IMAGING GUIDED PORT INSERTION  08/27/2019  . KNEE SURGERY    . LITHOTRIPSY       reports that he quit smoking about 36 years ago. He has never used smokeless tobacco. He reports that he does not drink alcohol and does not use drugs.  Allergies  Allergen Reactions  . Advil [Ibuprofen] Other (See Comments)    Face and body numbness  . Nyquil Multi-Symptom [Pseudoeph-Doxylamine-Dm-Apap]  Other (See Comments)    Increase in bp   . Sulfa Antibiotics Nausea And Vomiting    Family History  Problem Relation Age of Onset  . Emphysema Mother   . Stroke Father   . Cirrhosis Sister   . Heart disease Maternal Grandfather   . Diabetes Paternal Grandmother   . Colon cancer Paternal Grandfather   . Emphysema Sister   . Colon cancer Sister   . Emphysema Brother   .  Thyroid disease Daughter   . Glaucoma Daughter   . Heart disease Son   . Gout Son   . Hypertension Son     Unacceptable: Noncontributory, unremarkable, or negative. Acceptable: (example)Family history negative for heart disease  Prior to Admission medications   Medication Sig Start Date End Date Taking? Authorizing Provider  acetaminophen (TYLENOL) 325 MG tablet Take 325 mg by mouth at bedtime.    Yes [provider]  ALPRAZolam (XANAX) 0.25 MG tablet TAKE 1 TABLET TWICE DAILY AS NEEDED FOR ANXIETY Patient taking differently: Take 0.25 mg by mouth 2 (two) times daily as needed for anxiety or sleep.  09/22/19  Yes Lockamy, Randi L, NP-C  atorvastatin (LIPITOR) 40 MG tablet Take 1 tablet (40 mg total) by mouth daily. 07/13/19 10/11/19 Yes Herminio Commons, MD  Cyanocobalamin (B-12 PO) Take 1 tablet by mouth daily.    Yes [provider]  escitalopram (LEXAPRO) 10 MG tablet Take 1 tablet (10 mg total) by mouth daily. 08/24/19  Yes Derek Jack, MD  gabapentin (NEURONTIN) 300 MG capsule Take 300 mg by mouth 3 (three) times daily. 04/27/19  Yes [provider]  Glucosamine HCl (GLUCOSAMINE PO) Take 1 tablet by mouth daily.   Yes [provider]  HYDROcodone-acetaminophen (NORCO) 10-325 MG tablet Take 1 tablet by mouth every 8 (eight) hours as needed. Patient taking differently: Take 1 tablet by mouth in the morning and at bedtime.  09/10/19  Yes Derek Jack, MD  Lactulose 20 GM/30ML SOLN Take 30 mLs (20 g total) by mouth at bedtime. 08/24/19  Yes Derek Jack, MD  LORazepam (ATIVAN) 0.5 MG tablet Take 0.5 mg by mouth at bedtime as needed for anxiety or sleep.    Yes [provider]  magic mouthwash w/lidocaine SOLN Take 5 mLs by mouth 4 (four) times daily as needed for mouth pain. Swish and spit. 09/14/19  Yes Derek Jack, MD  magnesium citrate SOLN Drink 1/2 bottle; repeat 2 hours later if no bowel movement Patient  taking differently: Take 0.5-1 Bottles by mouth See admin instructions. Drink 1/2 bottle; repeat 2 hours later if no bowel movement 09/22/19  Yes Kefalas, Manon Hilding, PA-C  megestrol (MEGACE) 40 MG/ML suspension Take 400 mg by mouth daily.   Yes [provider]  meloxicam (MOBIC) 15 MG tablet Take 15 mg by mouth daily.    Yes [provider]  metoprolol tartrate (LOPRESSOR) 25 MG tablet Take 1 tablet (25 mg total) by mouth 2 (two) times daily. 07/13/19 10/11/19 Yes Herminio Commons, MD  Multiple Vitamin (MULTIVITAMIN WITH MINERALS) TABS tablet Take 1 tablet by mouth daily.   Yes [provider]  Omega-3 Fatty Acids (FISH OIL) 500 MG CAPS Take 1 capsule by mouth daily.    Yes [provider]  pantoprazole (PROTONIX) 40 MG tablet Take 40 mg by mouth daily. 06/08/19  Yes [provider]  prochlorperazine (COMPAZINE) 10 MG tablet Take 1 tablet (10 mg total) by mouth every 6 (six) hours as needed (Nausea or  vomiting). 09/08/19  Yes Derek Jack, MD  promethazine (PHENERGAN) 25 MG tablet Take 25 mg by mouth as needed for nausea or vomiting.  07/09/19  Yes [provider]  senna-docusate (SENNA S) 8.6-50 MG tablet Take 1 tablet by mouth 2 (two) times daily. Patient taking differently: Take 1 tablet by mouth 2 (two) times daily as needed for mild constipation.  09/22/19  Yes Kefalas, Manon Hilding, PA-C  sucralfate (CARAFATE) 1 g tablet Take 1 g by mouth 4 (four) times daily -  with meals and at bedtime.  07/02/19  Yes [provider]  triamcinolone (KENALOG) 0.1 % paste Use as directed 1 application in the mouth or throat at bedtime. APPLIED TO TEETH NIGHTLY 09/16/19  Yes [provider]  benazepril (LOTENSIN) 10 MG tablet Take 10 mg by mouth daily. Patient not taking: Reported on 09/30/2019    [provider]  BEVACIZUMAB IV Inject 5 mg/kg into the vein every 14 (fourteen) days. Patient not taking: Reported on 09/30/2019 09/09/19    [provider]  fluorouracil CALGB 72620 in sodium chloride 0.9 % 150 mL Inject 2,400 mg/m2 into the vein over 48 hr. Patient not taking: Reported on 09/30/2019 09/09/19   [provider]  FLUOROURACIL IV Inject 400 mg/m2 into the vein every 14 (fourteen) days. Patient not taking: Reported on 09/30/2019 09/09/19   [provider]  LEUCOVORIN CALCIUM IV Inject 400 mg/m2 into the vein every 14 (fourteen) days. Patient not taking: Reported on 09/30/2019 09/09/19   [provider]  lidocaine-prilocaine (EMLA) cream Apply a small amount to port a cath site and cover with plastic wrap 1 hour prior to chemotherapy appointments Patient not taking: Reported on 09/23/2019 09/08/19   Derek Jack, MD  lisinopril-hydrochlorothiazide (ZESTORETIC) 10-12.5 MG tablet Take 1 tablet by mouth daily. Patient not taking: Reported on 09/30/2019 05/04/19   [provider]  ondansetron (ZOFRAN) 8 MG tablet Take 8 mg by mouth as needed for nausea or vomiting.  Patient not taking: Reported on 09/23/2019 07/16/19   [provider]  oxaliplatin in dextrose 5 % 500 mL Inject 85 mg/m2 into the vein once. Patient not taking: Reported on 09/30/2019 09/09/19   [provider]    Physical Exam: Vitals:   10/01/19 0030 10/01/19 0039 10/01/19 0100 10/01/19 0205  BP: 97/71 97/71 108/72 122/86  Pulse: 83 79 85 87  Resp: (!) 21 20 (!) 21 (!) 24  Temp:    98.2 F (36.8 C)  TempSrc:    Oral  SpO2: 100% 96% 99% 97%  Weight:      Height:        Constitutional: NAD, calm, comfortable Vitals:   10/01/19 0030 10/01/19 0039 10/01/19 0100 10/01/19 0205  BP: 97/71 97/71 108/72 122/86  Pulse: 83 79 85 87  Resp: (!) 21 20 (!) 21 (!) 24  Temp:    98.2 F (36.8 C)  TempSrc:    Oral  SpO2: 100% 96% 99% 97%  Weight:      Height:         General: 74 year old cachectic male in no acute distress Eyes: PERRL, lids and conjunctivae normal ENMT: Mucous membranes are moist.  Posterior pharynx clear of any exudate or lesions.Normal dentition.  Neck: normal, supple, no masses, no thyromegaly Respiratory: Patient is on 2 L of oxygen with nasal cannula.  Diminished breath sounds in left lower lobe but no wheezing, no crackles. Normal respiratory effort. No accessory muscle use.  Cardiovascular: Regular rate and rhythm, no  murmurs / rubs / gallops. No extremity edema. 2+ pedal pulses. No carotid bruits.  Abdomen: no tenderness, no masses palpated. No hepatosplenomegaly. Bowel sounds positive.  Musculoskeletal: no clubbing / cyanosis. No joint deformity upper and lower extremities. Good ROM, no contractures. Normal muscle tone.  Skin: no rashes, lesions, ulcers. No induration Neurologic: CN 2-12 grossly intact. Sensation intact, DTR normal. Strength 5/5 in all 4.  Psychiatric: Normal judgment and insight. Alert and oriented x 3. Normal mood.   (Anything < 9 systems with 2 bullets each down codes to level 1) (If patient refuses exam can't bill higher level) (Make sure to document decubitus ulcers present on admission -- if possible -- and whether patient has chronic indwelling catheter at time of admission)  Labs on Admission: I have personally reviewed following labs and imaging studies  CBC: Recent Labs  Lab 09/30/19 2018  WBC 9.1  NEUTROABS 6.0  HGB 10.2*  HCT 33.1*  MCV 91.7  PLT 474*   Basic Metabolic Panel: Recent Labs  Lab 09/30/19 2018  NA 133*  K 3.3*  CL 97*  CO2 24  GLUCOSE 106*  BUN 21  CREATININE 0.65  CALCIUM 8.5*   GFR: Estimated Creatinine Clearance: 90.2 mL/min (by C-G formula based on SCr of 0.65 mg/dL). Liver Function Tests: No results for input(s): AST, ALT, ALKPHOS, BILITOT, PROT, ALBUMIN in the last 168 hours. No results for input(s): LIPASE, AMYLASE in the last 168 hours. No results for input(s): AMMONIA in the last 168 hours. Coagulation Profile: No results for input(s): INR, PROTIME in the last 168 hours. Cardiac  Enzymes: No results for input(s): CKTOTAL, CKMB, CKMBINDEX, TROPONINI in the last 168 hours. BNP (last 3 results) No results for input(s): PROBNP in the last 8760 hours. HbA1C: No results for input(s): HGBA1C in the last 72 hours. CBG: No results for input(s): GLUCAP in the last 168 hours. Lipid Profile: No results for input(s): CHOL, HDL, LDLCALC, TRIG, CHOLHDL, LDLDIRECT in the last 72 hours. Thyroid Function Tests: No results for input(s): TSH, T4TOTAL, FREET4, T3FREE, THYROIDAB in the last 72 hours. Anemia Panel: No results for input(s): VITAMINB12, FOLATE, FERRITIN, TIBC, IRON, RETICCTPCT in the last 72 hours. Urine analysis:    Component Value Date/Time   COLORURINE YELLOW 09/13/2019 1541   APPEARANCEUR CLEAR 09/13/2019 1541   LABSPEC 1.031 (H) 09/13/2019 1541   PHURINE 7.0 09/13/2019 1541   GLUCOSEU NEGATIVE 09/13/2019 1541   HGBUR NEGATIVE 09/13/2019 1541   BILIRUBINUR NEGATIVE 09/13/2019 1541   KETONESUR NEGATIVE 09/13/2019 1541   PROTEINUR 30 (A) 09/13/2019 1541   NITRITE NEGATIVE 09/13/2019 1541   LEUKOCYTESUR NEGATIVE 09/13/2019 1541    Radiological Exams on Admission: DG Chest 2 View  Result Date: 09/30/2019 CLINICAL DATA:  Shortness of breath. EXAM: CHEST - 2 VIEW COMPARISON:  September 13, 2019. FINDINGS: Stable cardiomediastinal silhouette. No pneumothorax is noted. Right internal jugular Port-A-Cath is unchanged in position moderate left pleural effusion is noted with associated left basilar atelectasis or infiltrate. Minimal right basilar atelectasis is noted. Small right pleural effusion is noted. Bony thorax is unremarkable. IMPRESSION: Moderate left pleural effusion with associated left basilar atelectasis or infiltrate. Small right pleural effusion. Electronically Signed   By: Marijo Conception M.D.   On: 09/30/2019 20:00     Assessment/Plan Principal Problem:   Acute respiratory failure with hypoxia (HCC) Oxygen saturation dropped to 86% on room air and patient  started on 2 L of oxygen with nasal cannula to maintain oxygen saturation above 92%.  Continue  oxygen supplementation with nasal cannula. Chest x-ray showed moderate left-sided pleural effusion that is the most probable cause for shortness of breath and hypoxia.  IR consult ordered for evaluation and thoracentesis.  Active Problems:  Moderate left-sided pleural effusion IR consult ordered for evaluation and thoracentesis.    Metastatic cancer of colon [facility]) Patient is on chemotherapy Continue home pain medications and nausea medications  Chronic normocytic anemia Hemoglobin is at baseline.  Continue to monitor CBC  Hypertension  Stable  Continue home medication.  Hyperlipidemia Continue home medication  Depression/anxiety Patient denies any anxiety and depression at this time. Continue home medications   (please populate well all problems here in Problem List. (For example, if patient is on BP meds at home and you resume or decide to hold them, it is a problem that needs to be her. Same for CAD, COPD, HLD and so on)     DVT prophylaxis: Lovenox Code Status: Full code Family Communication: No family member present at this time Disposition Plan:  Consults called: IR consult for thoracentesis Admission status: Observation/telemetry   Edmonia Lynch MD [facility] Pager [phone]   If 7PM-7AM, please contact night-coverage [email or web address] Password   10/01/2019, 5:08 AM

## 2019-10-02 LAB — CYTOLOGY - NON PAP

## 2019-10-06 LAB — CULTURE, BODY FLUID W GRAM STAIN -BOTTLE
Culture: NO GROWTH
Special Requests: 10

## 2019-10-07 ENCOUNTER — Ambulatory Visit: Payer: Medicare Other | Admitting: Family Medicine

## 2019-10-08 ENCOUNTER — Telehealth: Payer: Medicare Other | Admitting: Family Medicine

## 2019-10-23 ENCOUNTER — Encounter (HOSPITAL_COMMUNITY): Payer: Medicare Other

## 2019-11-01 DEATH — deceased

## 2020-05-24 IMAGING — PT NM PET TUM IMG INITIAL (PI) SKULL BASE T - THIGH
3 series · 20 of 25 positions shown · non-contrast
Comparison: CTA chest 06/15/2019.  Abdomen and pelvis CT 07/06/2019

CLINICAL DATA: Initial treatment strategy for thoracic
lymphadenopathy.

EXAM:
NUCLEAR MEDICINE PET SKULL BASE TO THIGH
TECHNIQUE: 13.6 mCi F-18 FDG was injected intravenously. Full-ring PET imaging
was performed from the skull base to thigh after the radiotracer. CT
data was obtained and used for attenuation correction and anatomic
localization.
Fasting blood glucose: 100 mg/dl

[axial ct wb fusion · 14 of 193 slices shown]
[im 1/193]
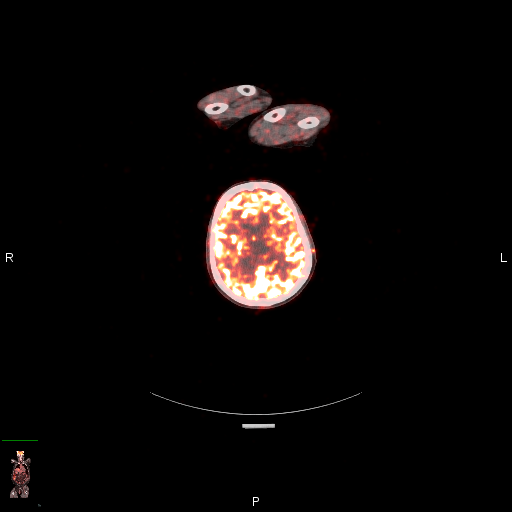
[im 12/193]
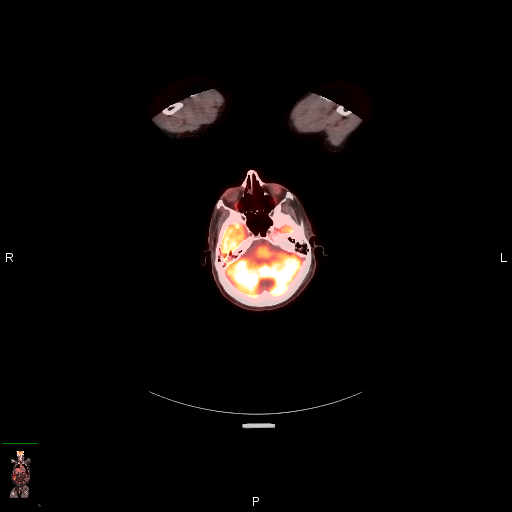
[im 34/193]
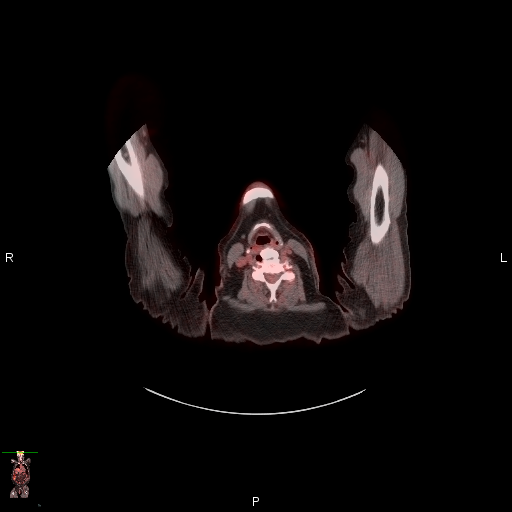
[im 46/193]
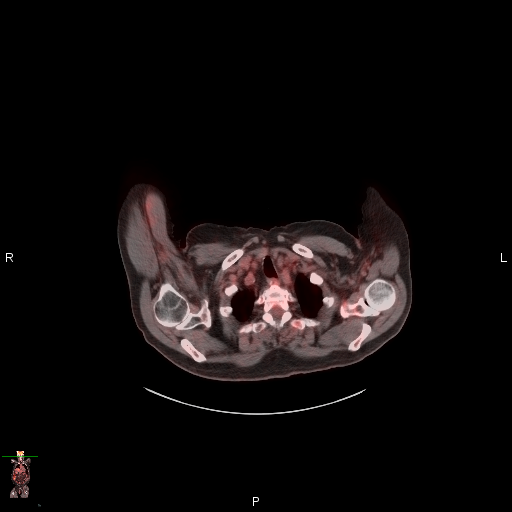
[im 57/193]
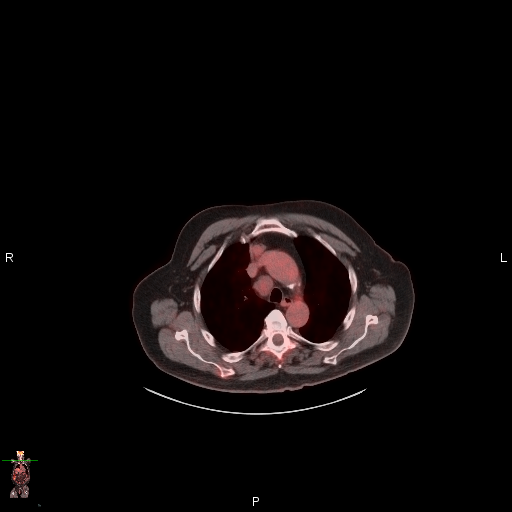
[im 68/193]
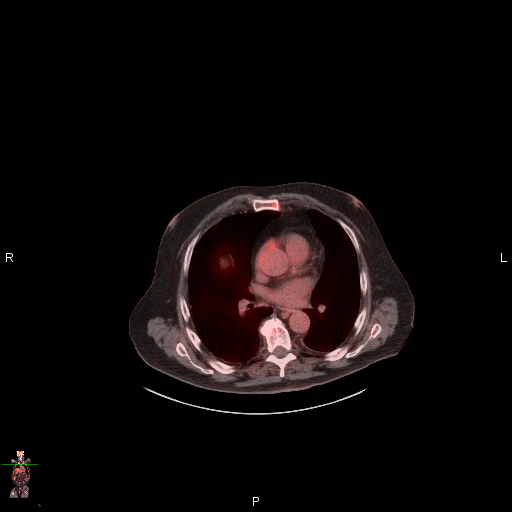
[im 91/193]
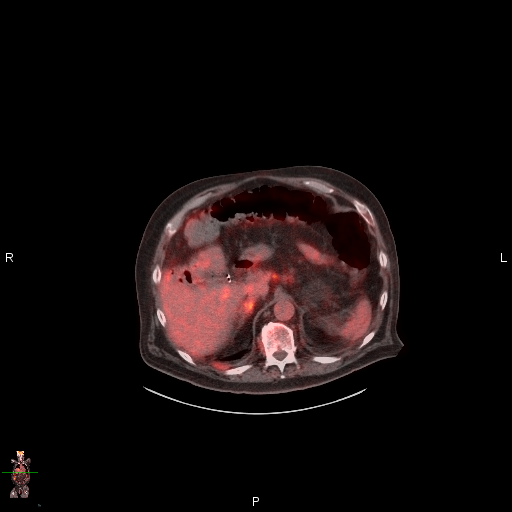
[im 102/193]
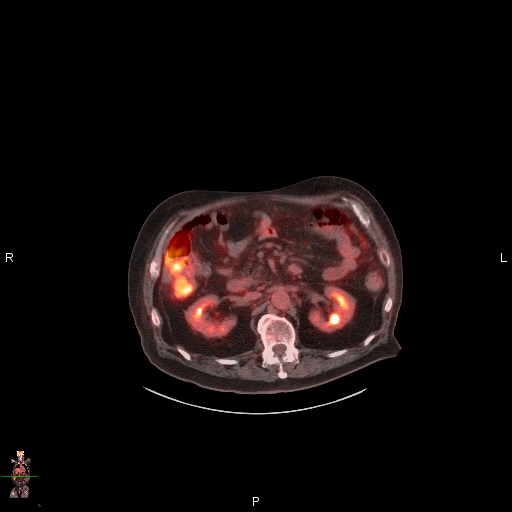
[im 113/193]
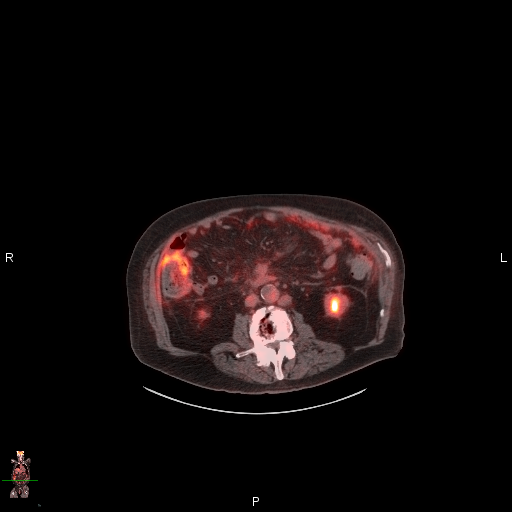
[im 125/193]
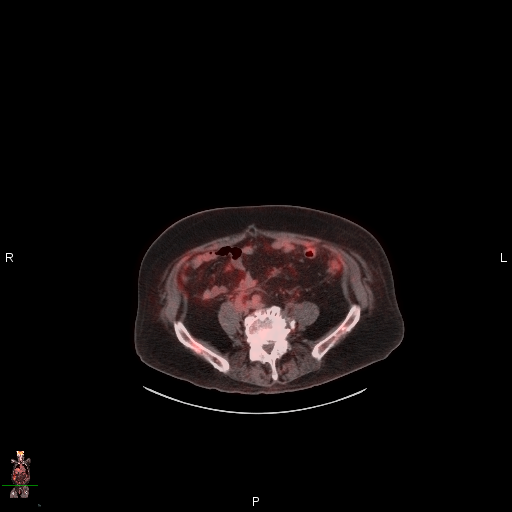
[im 147/193]
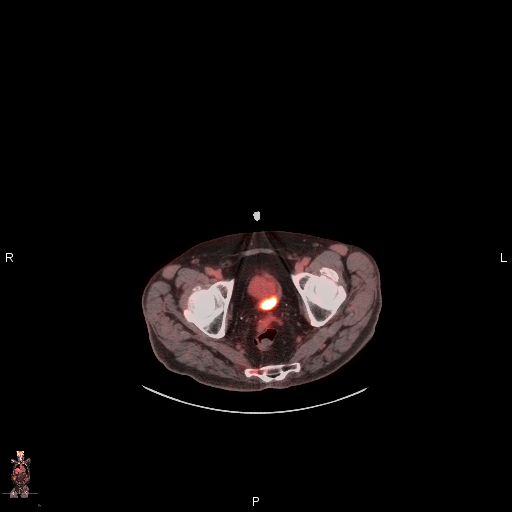
[im 159/193]
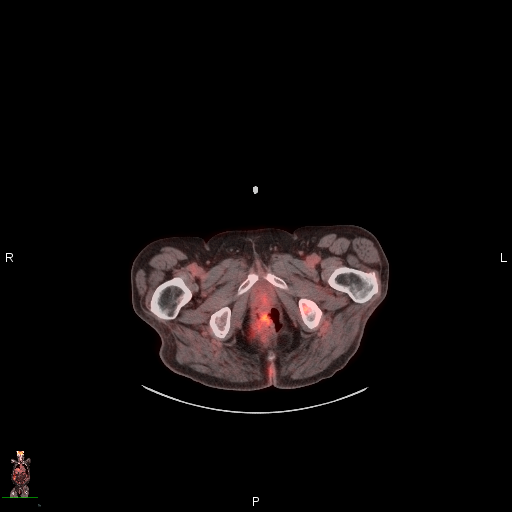
[im 170/193]
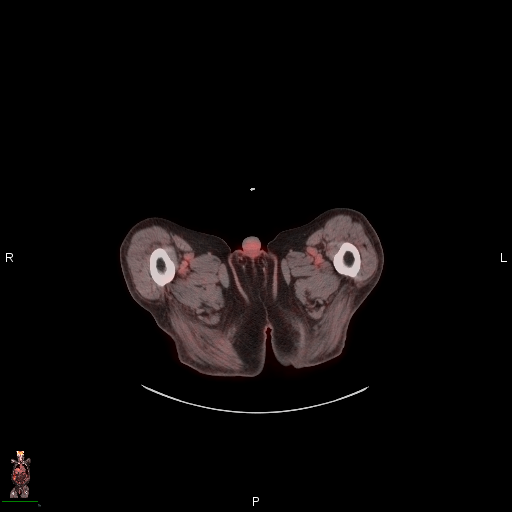
[im 181/193]
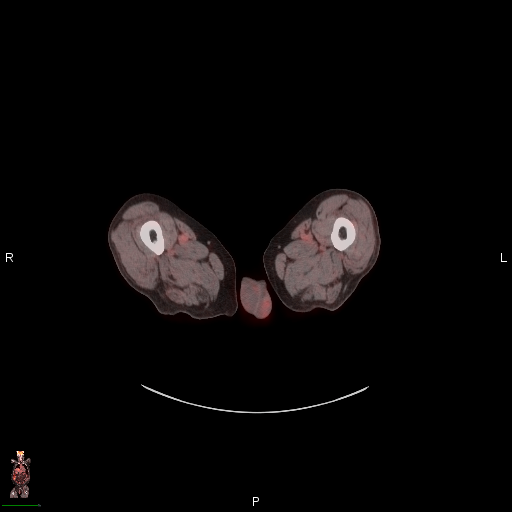

[mip · 4 of 48 slices shown]
[im 1/48]
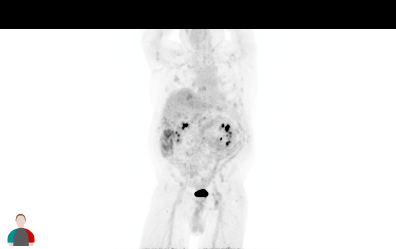
[im 16/48]
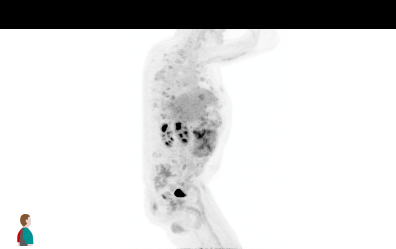
[im 32/48]
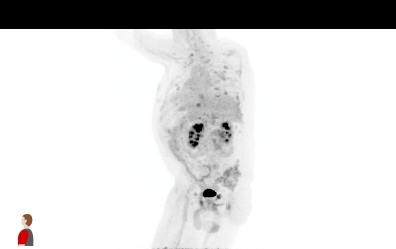
[im 48/48]
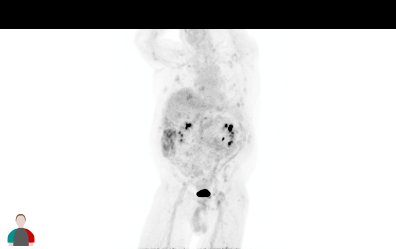

[coronal ct wb fusion · 2 of 35 slices shown]
[im 18/35]
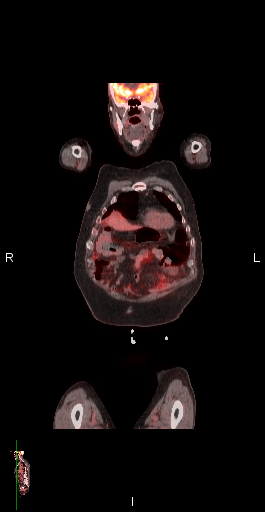
[im 35/35]
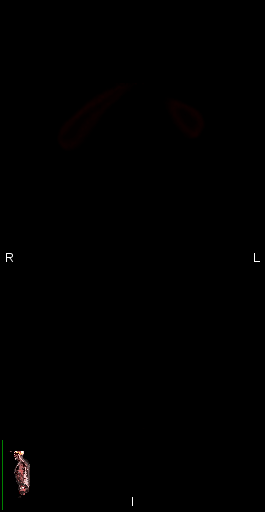

[20 of 25 positions shown; findings below may reference images not displayed]

FINDINGS: Mediastinal blood pool activity: SUV max

Liver activity: SUV max NA

NECK: No hypermetabolic lymph nodes in the neck.

Incidental CT findings: none

CHEST: Hypermetabolic mediastinal lymphadenopathy evident. 2.1 cm
short axis right paratracheal node on 113/3 demonstrates SUV max =
3.3. 1.7 cm short axis anterior mediastinal node on the same image
demonstrates SUV max = 3.7. No hypermetabolic lymphadenopathy seen
in either axillary region. Focal hypermetabolism in the right hilum
demonstrates SUV max = 4.3.

Hypermetabolic subpleural nodules are seen in both lungs. Index 9 mm
pleural-based nodule in the right upper lobe (121/3) demonstrates
SUV max = 3.8. 1.5 cm paraspinal right lower lobe subpleural nodule
demonstrates SUV max = 5.2. 2 cm posterior left lower lobe
subpleural nodule (164/3) demonstrates SUV max = 6.1.

Scattered tiny pulmonary nodules are below the size threshold for
reliable resolution on PET imaging.

Incidental CT findings: Coronary artery calcification is evident.
Atherosclerotic calcification is noted in the wall of the thoracic
aorta.

ABDOMEN/PELVIS: 2.2 cm lesion anterior hepatic dome (154/3) is
hypermetabolic with SUV max = 7.1. Background mottled liver uptake
evident, but likely additional tiny hypermetabolic liver lesions.

2 cm right adrenal nodule is hypermetabolic with SUV max = 6.2.

There is hypermetabolic lymphadenopathy in the upper abdomen with 14
mm short axis pre caval node (194/3) demonstrating SUV max =

Omental nodularity, nearly confluent in some regions (see image
226/3) is hypermetabolic.

Marked hypermetabolism is seen in the right colon with SUV max =
11.5. This corresponds to a region of irregular wall thickening in
the ascending colon, partially obscured by motion (image 212/series
3).

There is an area of wall thickening showing low attenuation/fat
attenuation in the gastric fundus, as described on previous CT scan.
This region of the stomach shows no substantial hypermetabolic FDG
uptake.

Incidental CT findings: There is abdominal aortic atherosclerosis
without aneurysm. Small volume intraperitoneal free fluid.

SKELETON: Numerous hypermetabolic bone metastases are evident
involve an spine, ribs, scapula day, and bony pelvis. Index lesion
in the right sacrum demonstrates SUV max = 6.4.

Incidental CT findings: Degenerative changes noted lumbar spine.
IMPRESSION: 1. Widespread hypermetabolic metastatic disease in the chest,
abdomen, and pelvis. This includes mediastinal and potentially right
hilar lymphadenopathy, bilateral subpleural lung nodules, hepatic
lesion(s), adrenal disease, abdominal lymphadenopathy omental
nodularity, and bone metastases. Numerous tiny pulmonary nodules are
too small to characterize by PET imaging. There is focal
hypermetabolism in the ascending colon and although not well
demonstrated on today's study or the previous diagnostic
abdomen/pelvis CT, a suggestion of irregular wall thickening in this
region raises the question of primary neoplasm of the right colon.
No other definite primary source identified on this study.
2.  Aortic Atherosclerois ([id number]-170.0)
